# Patient Record
Sex: Female | Born: 1935 | Race: White | Hispanic: No | Smoking: Current every day smoker
Health system: Southern US, Community
[De-identification: ages and names within clinical notes are randomized; demographics above are authoritative.]

## PROBLEM LIST (undated history)

## (undated) DIAGNOSIS — D649 Anemia, unspecified: Secondary | ICD-10-CM

---

## 2019-11-01 ENCOUNTER — Ambulatory Visit: Payer: Self-pay | Attending: Internal Medicine

## 2019-11-01 DIAGNOSIS — Z23 Encounter for immunization: Secondary | ICD-10-CM | POA: Insufficient documentation

## 2019-11-01 NOTE — Progress Notes (Signed)
   Covid-19 Vaccination Clinic  Name:  Idolina Mantell    MRN: [id number] DOB: 15-Jul-1926  11/01/2019  Ms. Kinner was observed post Covid-19 immunization for 15 minutes without incidence. She was provided with Vaccine Information Sheet and instruction to access the V-Safe system.   Ms. Abdulla was instructed to call 911 with any severe reactions post vaccine: Marland Kitchen Difficulty breathing  . Swelling of your face and throat  . A fast heartbeat  . A bad rash all over your body  . Dizziness and weakness    Immunizations Administered    Name Date Dose VIS Date Route   Pfizer COVID-19 Vaccine 11/01/2019  4:29 PM 0.3 mL 08/14/2019 Intramuscular   Manufacturer: [employer], [employer]   Lot: [id number]   NDC: [address]-2

## 2019-12-02 ENCOUNTER — Ambulatory Visit: Payer: Self-pay | Attending: Internal Medicine

## 2019-12-02 DIAGNOSIS — Z23 Encounter for immunization: Secondary | ICD-10-CM

## 2019-12-02 NOTE — Progress Notes (Signed)
   Covid-19 Vaccination Clinic  Name:  Dawn Oneill    MRN: [id number] DOB: Sep 23, 1925  12/02/2019  [name] was observed post Covid-19 immunization for 15 minutes without incident. She was provided with Vaccine Information Sheet and instruction to access the V-Safe system.   [name] was instructed to call 911 with any severe reactions post vaccine: Marland Kitchen Difficulty breathing  . Swelling of face and throat  . A fast heartbeat  . A bad rash all over body  . Dizziness and weakness   Immunizations Administered    Name Date Dose VIS Date Route   Pfizer COVID-19 Vaccine 12/02/2019  4:43 PM 0.3 mL 08/14/2019 Intramuscular   Manufacturer: [employer], [employer]   Lot: [id number]   NDC: [address]-8

## 2020-07-02 ENCOUNTER — Ambulatory Visit: Payer: Medicare Other | Attending: Internal Medicine

## 2020-07-02 DIAGNOSIS — Z23 Encounter for immunization: Secondary | ICD-10-CM

## 2020-07-02 NOTE — Progress Notes (Signed)
   Covid-19 Vaccination Clinic  Name:  Avy Barlett    MRN: [id number] DOB: 03-27-1926  07/02/2020  Ms. Carlson was observed post Covid-19 immunization for 15 minutes without incident. She was provided with Vaccine Information Sheet and instruction to access the V-Safe system.   Ms. Duhart was instructed to call 911 with any severe reactions post vaccine: Marland Kitchen Difficulty breathing  . Swelling of face and throat  . A fast heartbeat  . A bad rash all over body  . Dizziness and weakness

## 2020-09-29 ENCOUNTER — Emergency Department (HOSPITAL_COMMUNITY): Payer: Medicare Other

## 2020-09-29 ENCOUNTER — Encounter (HOSPITAL_COMMUNITY): Payer: Self-pay

## 2020-09-29 ENCOUNTER — Inpatient Hospital Stay (HOSPITAL_COMMUNITY)
Admission: EM | Admit: 2020-09-29 | Discharge: 2020-10-05 | DRG: 480 | Disposition: A | Discharge status: Skilled Nursing Facility | Payer: Medicare Other | Attending: Internal Medicine | Admitting: Internal Medicine

## 2020-09-29 ENCOUNTER — Other Ambulatory Visit: Payer: Self-pay

## 2020-09-29 DIAGNOSIS — F17210 Nicotine dependence, cigarettes, uncomplicated: Secondary | ICD-10-CM | POA: Diagnosis present

## 2020-09-29 DIAGNOSIS — J9601 Acute respiratory failure with hypoxia: Secondary | ICD-10-CM | POA: Diagnosis not present

## 2020-09-29 DIAGNOSIS — Z20822 Contact with and (suspected) exposure to covid-19: Secondary | ICD-10-CM | POA: Diagnosis present

## 2020-09-29 DIAGNOSIS — Y92008 Other place in unspecified non-institutional (private) residence as the place of occurrence of the external cause: Secondary | ICD-10-CM

## 2020-09-29 DIAGNOSIS — R54 Age-related physical debility: Secondary | ICD-10-CM | POA: Diagnosis present

## 2020-09-29 DIAGNOSIS — S60511A Abrasion of right hand, initial encounter: Secondary | ICD-10-CM | POA: Diagnosis present

## 2020-09-29 DIAGNOSIS — S72002A Fracture of unspecified part of neck of left femur, initial encounter for closed fracture: Secondary | ICD-10-CM | POA: Diagnosis not present

## 2020-09-29 DIAGNOSIS — D72829 Elevated white blood cell count, unspecified: Secondary | ICD-10-CM | POA: Diagnosis present

## 2020-09-29 DIAGNOSIS — Z7982 Long term (current) use of aspirin: Secondary | ICD-10-CM

## 2020-09-29 DIAGNOSIS — W010XXA Fall on same level from slipping, tripping and stumbling without subsequent striking against object, initial encounter: Secondary | ICD-10-CM | POA: Diagnosis present

## 2020-09-29 DIAGNOSIS — F419 Anxiety disorder, unspecified: Secondary | ICD-10-CM | POA: Diagnosis present

## 2020-09-29 DIAGNOSIS — Z01811 Encounter for preprocedural respiratory examination: Secondary | ICD-10-CM

## 2020-09-29 DIAGNOSIS — I10 Essential (primary) hypertension: Secondary | ICD-10-CM | POA: Diagnosis present

## 2020-09-29 DIAGNOSIS — I714 Abdominal aortic aneurysm, without rupture, unspecified: Secondary | ICD-10-CM | POA: Diagnosis present

## 2020-09-29 DIAGNOSIS — G9341 Metabolic encephalopathy: Secondary | ICD-10-CM | POA: Diagnosis not present

## 2020-09-29 DIAGNOSIS — E039 Hypothyroidism, unspecified: Secondary | ICD-10-CM | POA: Diagnosis present

## 2020-09-29 DIAGNOSIS — M549 Dorsalgia, unspecified: Secondary | ICD-10-CM | POA: Diagnosis present

## 2020-09-29 DIAGNOSIS — F05 Delirium due to known physiological condition: Secondary | ICD-10-CM | POA: Diagnosis present

## 2020-09-29 DIAGNOSIS — R911 Solitary pulmonary nodule: Secondary | ICD-10-CM | POA: Diagnosis present

## 2020-09-29 DIAGNOSIS — S72142A Displaced intertrochanteric fracture of left femur, initial encounter for closed fracture: Secondary | ICD-10-CM | POA: Diagnosis present

## 2020-09-29 DIAGNOSIS — I6523 Occlusion and stenosis of bilateral carotid arteries: Secondary | ICD-10-CM | POA: Diagnosis not present

## 2020-09-29 DIAGNOSIS — G8929 Other chronic pain: Secondary | ICD-10-CM | POA: Diagnosis present

## 2020-09-29 DIAGNOSIS — Z79899 Other long term (current) drug therapy: Secondary | ICD-10-CM

## 2020-09-29 DIAGNOSIS — F32A Depression, unspecified: Secondary | ICD-10-CM | POA: Diagnosis present

## 2020-09-29 DIAGNOSIS — W19XXXA Unspecified fall, initial encounter: Secondary | ICD-10-CM

## 2020-09-29 DIAGNOSIS — I6529 Occlusion and stenosis of unspecified carotid artery: Secondary | ICD-10-CM | POA: Diagnosis present

## 2020-09-29 DIAGNOSIS — D509 Iron deficiency anemia, unspecified: Secondary | ICD-10-CM | POA: Diagnosis present

## 2020-09-29 DIAGNOSIS — S60512A Abrasion of left hand, initial encounter: Secondary | ICD-10-CM | POA: Diagnosis present

## 2020-09-29 DIAGNOSIS — Z79890 Hormone replacement therapy (postmenopausal): Secondary | ICD-10-CM

## 2020-09-29 DIAGNOSIS — D62 Acute posthemorrhagic anemia: Secondary | ICD-10-CM | POA: Diagnosis not present

## 2020-09-29 DIAGNOSIS — M25552 Pain in left hip: Secondary | ICD-10-CM | POA: Diagnosis present

## 2020-09-29 DIAGNOSIS — Z419 Encounter for procedure for purposes other than remedying health state, unspecified: Secondary | ICD-10-CM

## 2020-09-29 HISTORY — DX: Anemia, unspecified: D64.9

## 2020-09-29 LAB — CBC WITH DIFFERENTIAL/PLATELET
Abs Immature Granulocytes: 0 10*3/uL (ref 0.00–0.07)
Basophils Absolute: 0 10*3/uL (ref 0.0–0.1)
Basophils Relative: 0 %
Eosinophils Absolute: 0 10*3/uL (ref 0.0–0.5)
Eosinophils Relative: 0 %
HCT: 33.6 % — ABNORMAL LOW (ref 36.0–46.0)
Hemoglobin: 10.5 g/dL — ABNORMAL LOW (ref 12.0–15.0)
Lymphocytes Relative: 4 %
Lymphs Abs: 1 10*3/uL (ref 0.7–4.0)
MCH: 19.6 pg — ABNORMAL LOW (ref 26.0–34.0)
MCHC: 31.3 g/dL (ref 30.0–36.0)
MCV: 62.7 fL — ABNORMAL LOW (ref 80.0–100.0)
Monocytes Absolute: 0.5 10*3/uL (ref 0.1–1.0)
Monocytes Relative: 2 %
Neutro Abs: 22.5 10*3/uL — ABNORMAL HIGH (ref 1.7–7.7)
Neutrophils Relative %: 94 %
Platelets: 306 10*3/uL (ref 150–400)
RBC: 5.36 MIL/uL — ABNORMAL HIGH (ref 3.87–5.11)
RDW: 14.9 % (ref 11.5–15.5)
WBC: 23.9 10*3/uL — ABNORMAL HIGH (ref 4.0–10.5)
nRBC: 0 /100 WBC
nRBC: 0.1 % (ref 0.0–0.2)

## 2020-09-29 LAB — PROTIME-INR
INR: 1.3 — ABNORMAL HIGH (ref 0.8–1.2)
Prothrombin Time: 15.3 seconds — ABNORMAL HIGH (ref 11.4–15.2)

## 2020-09-29 LAB — COMPREHENSIVE METABOLIC PANEL
ALT: 14 U/L (ref 0–44)
AST: 21 U/L (ref 15–41)
Albumin: 3.5 g/dL (ref 3.5–5.0)
Alkaline Phosphatase: 83 U/L (ref 38–126)
Anion gap: 12 (ref 5–15)
BUN: 10 mg/dL (ref 8–23)
CO2: 23 mmol/L (ref 22–32)
Calcium: 9 mg/dL (ref 8.9–10.3)
Chloride: 101 mmol/L (ref 98–111)
Creatinine, Ser: 0.76 mg/dL (ref 0.44–1.00)
GFR, Estimated: >60 mL/min (ref >60)
Glucose, Bld: 144 mg/dL — ABNORMAL HIGH (ref 70–99)
Potassium: 3.8 mmol/L (ref 3.5–5.1)
Sodium: 136 mmol/L (ref 135–145)
Total Bilirubin: 1.2 mg/dL (ref 0.3–1.2)
Total Protein: 6.2 g/dL — ABNORMAL LOW (ref 6.5–8.1)

## 2020-09-29 LAB — SARS CORONAVIRUS 2 BY RT PCR (HOSPITAL ORDER, PERFORMED IN ~~LOC~~ HOSPITAL LAB): SARS Coronavirus 2: NEGATIVE

## 2020-09-29 LAB — TROPONIN I (HIGH SENSITIVITY): Troponin I (High Sensitivity): 4 ng/L (ref <18)

## 2020-09-29 LAB — TSH: TSH: 9.554 u[IU]/mL — ABNORMAL HIGH (ref 0.350–4.500)

## 2020-09-29 MED ORDER — SENNOSIDES-DOCUSATE SODIUM 8.6-50 MG PO TABS: 1.0000 | ORAL_TABLET | Freq: Every evening | ORAL | Status: DC | PRN

## 2020-09-29 MED ORDER — FENTANYL CITRATE (PF) 100 MCG/2ML IJ SOLN
12.5000 ug | INTRAMUSCULAR | Status: DC | PRN
  Administered 2020-09-29: 12.5 ug via INTRAVENOUS

## 2020-09-29 MED ORDER — LEVOTHYROXINE SODIUM 75 MCG PO TABS
75.0000 ug | ORAL_TABLET | Freq: Every day | ORAL | Status: DC
  Administered 2020-09-30: 75 ug via ORAL
  Filled 2020-09-29: qty 1

## 2020-09-29 MED ORDER — ALPRAZOLAM 0.25 MG PO TABS: 0.2500 mg | ORAL_TABLET | Freq: Two times a day (BID) | ORAL | Status: DC | PRN

## 2020-09-29 MED ORDER — SERTRALINE HCL 100 MG PO TABS
100.0000 mg | ORAL_TABLET | Freq: Every day | ORAL | Status: DC
  Administered 2020-10-01 – 2020-10-05 (×5): 100 mg via ORAL
  Filled 2020-09-29 (×6): qty 1

## 2020-09-29 MED ORDER — BACLOFEN 10 MG PO TABS
5.0000 mg | ORAL_TABLET | Freq: Three times a day (TID) | ORAL | Status: DC | PRN
  Administered 2020-09-29 – 2020-10-04 (×3): 5 mg via ORAL
  Filled 2020-09-29 (×2): qty 1

## 2020-09-29 MED ORDER — ATORVASTATIN CALCIUM 10 MG PO TABS
10.0000 mg | ORAL_TABLET | Freq: Every day | ORAL | Status: DC
  Administered 2020-09-29: 10 mg via ORAL
  Filled 2020-09-29: qty 1

## 2020-09-29 MED ORDER — FENTANYL CITRATE (PF) 100 MCG/2ML IJ SOLN
12.5000 ug | INTRAMUSCULAR | Status: DC | PRN
  Filled 2020-09-29: qty 2

## 2020-09-29 MED ORDER — AMLODIPINE BESYLATE 10 MG PO TABS
10.0000 mg | ORAL_TABLET | Freq: Every day | ORAL | Status: DC
Start: 2020-09-30 — End: 2020-09-30

## 2020-09-29 MED ORDER — GABAPENTIN 100 MG PO CAPS
100.0000 mg | ORAL_CAPSULE | Freq: Two times a day (BID) | ORAL | Status: DC
Start: 2020-09-29 — End: 2020-09-30
  Administered 2020-09-29: 100 mg via ORAL
  Filled 2020-09-29: qty 1

## 2020-09-29 MED ORDER — SODIUM CHLORIDE 0.9 % IV SOLN: INTRAVENOUS | Status: AC

## 2020-09-29 NOTE — Progress Notes (Signed)
Patient discussed with EDP.  Recommended urgent stabilization with surgery in the morning for this left hip fracture.  NPO tonight at [place].  Appreciate medical management by hospitalist service.

## 2020-09-29 NOTE — H&P (Signed)
History and Physical    [name] ERD:[id number] DOB: 22-Apr-1926 DOA: 09/29/2020  PCP: Patient, No Pcp Per   Patient coming from: Home   Chief Complaint: fall with left hip pain   HPI: Dawn Oneill is a 87 y.o. female with medical history significant for chronic microcytic anemia, chronic back and knee pain, carotid artery stenosis and AAA, hypothyroidism, hypertension, and ongoing tobacco abuse, now presenting to the emergency department for evaluation of severe left hip pain and deformity after a fall at home.  Patient reports that she was in her usual state of health when her dog bumped into her and caused her to fall.  She denies hitting her head or losing consciousness but experienced immediate and severe pain at the left hip and was unable to bear weight.  Patient reports that she ambulates unassisted normally, has a bedroom on a second floor, since flight of stairs multiple times daily without any significant dyspnea.  She never experiences chest pain or diaphoresis with exertion.  She lives with her daughter, no longer cooks, but does laundry and other housework and is generally independent.  She continues to smoke almost 1 pack/day.  ED Course: Upon arrival to the ED, patient is found to be afebrile, saturating well on room air, and with stable blood pressure.  EKG features sinus rhythm.  Noncontrast head CT negative for acute intracranial abnormality and no acute fracture on cervical spine CT.  Chest x-ray is notable for possible pulmonary nodule with outpatient CT recommended.  Plain films of the left hip and femur demonstrate acute displaced comminuted left proximal femur fracture.  Chemistry panel is unremarkable and CBC notable for leukocytosis to 23,900 and a microcytic anemia with hemoglobin 10.5.  Orthopedic surgery was consulted by the ED physician and hospitalist called for admission.  Review of Systems:  All other systems reviewed and apart from HPI, are  negative.  Past Medical History:  Diagnosis Date  . Anemia     History reviewed. No pertinent surgical history.  Social History:   reports that she has been smoking. She has been smoking about 1.00 pack per day. She has never used smokeless tobacco. No history on file for alcohol use and drug use.  Not on File  History reviewed. No pertinent family history.   Prior to Admission medications   Not on File    Physical Exam: Vitals:   09/29/20 2015 09/29/20 2130 09/29/20 2145 09/29/20 2200  BP: 138/70 117/77 138/70 (!) 143/70  Pulse: 100 97 (!) 101 95  Resp: (!) 22 (!) 21 (!) 24 (!) 28  Temp:      TempSrc:      SpO2: 95% 96% 95% 95%  Weight:      Height:        Constitutional: NAD, calm  Eyes: PERTLA, lids and conjunctivae normal ENMT: Mucous membranes are moist. Posterior pharynx clear of any exudate or lesions.   Neck: normal, supple, no masses, no thyromegaly Respiratory: no wheezing, no crackles. No accessory muscle use.  Cardiovascular: S1 & S2 heard, regular rate and rhythm. No extremity edema.  Abdomen: No distension, no tenderness, soft. Bowel sounds active.  Musculoskeletal: no clubbing / cyanosis. No joint deformity upper and lower extremities.   Skin: no significant rashes, lesions, ulcers. Warm, dry, well-perfused. Neurologic: CN 2-12 grossly intact. Sensation intact. Moving all extremities.  Psychiatric: Alert and oriented to person, place, and situation. Very pleasant and cooperative.    Labs and Imaging on Admission: I have personally  reviewed following labs and imaging studies  CBC: Recent Labs  Lab 09/29/20 1852  WBC 23.9*  NEUTROABS 22.5*  HGB 10.5*  HCT 33.6*  MCV 62.7*  PLT 306   Basic Metabolic Panel: Recent Labs  Lab 09/29/20 1852  NA 136  K 3.8  CL 101  CO2 23  GLUCOSE 144*  BUN 10  CREATININE 0.76  CALCIUM 9.0   GFR: Estimated Creatinine Clearance: 35.3 mL/min (by C-G formula based on SCr of 0.76 mg/dL). Liver Function  Tests: Recent Labs  Lab 09/29/20 1852  AST 21  ALT 14  ALKPHOS 83  BILITOT 1.2  PROT 6.2*  ALBUMIN 3.5   No results for input(s): LIPASE, AMYLASE in the last 168 hours. No results for input(s): AMMONIA in the last 168 hours. Coagulation Profile: Recent Labs  Lab 09/29/20 1852  INR 1.3*   Cardiac Enzymes: No results for input(s): CKTOTAL, CKMB, CKMBINDEX, TROPONINI in the last 168 hours. BNP (last 3 results) No results for input(s): PROBNP in the last 8760 hours. HbA1C: No results for input(s): HGBA1C in the last 72 hours. CBG: No results for input(s): GLUCAP in the last 168 hours. Lipid Profile: No results for input(s): CHOL, HDL, LDLCALC, TRIG, CHOLHDL, LDLDIRECT in the last 72 hours. Thyroid Function Tests: Recent Labs    09/29/20 2024  TSH 9.554*   Anemia Panel: No results for input(s): VITAMINB12, FOLATE, FERRITIN, TIBC, IRON, RETICCTPCT in the last 72 hours. Urine analysis: No results found for: COLORURINE, APPEARANCEUR, LABSPEC, PHURINE, GLUCOSEU, HGBUR, BILIRUBINUR, KETONESUR, PROTEINUR, UROBILINOGEN, NITRITE, LEUKOCYTESUR Sepsis Labs: @LABRCNTIP (procalcitonin:4,lacticidven:4) ) Recent Results (from the past 240 hour(s))  SARS Coronavirus 2 by RT PCR (hospital order, performed in [facility] hospital lab) Nasopharyngeal Nasopharyngeal Swab     Status: None   Collection Time: 09/29/20  6:52 PM   Specimen: Nasopharyngeal Swab  Result Value Ref Range Status   SARS Coronavirus 2 NEGATIVE NEGATIVE Final    Comment: (NOTE) SARS-CoV-2 target nucleic acids are NOT DETECTED.  The SARS-CoV-2 RNA is generally detectable in upper and lower respiratory specimens during the acute phase of infection. The lowest concentration of SARS-CoV-2 viral copies this assay can detect is 250 copies / mL. A negative result does not preclude SARS-CoV-2 infection and should not be used as the sole basis for treatment or other patient management decisions.  A negative result may occur  with improper specimen collection / handling, submission of specimen other than nasopharyngeal swab, presence of viral mutation(s) within the areas targeted by this assay, and inadequate number of viral copies (<250 copies / mL). A negative result must be combined with clinical observations, patient history, and epidemiological information.  Fact Sheet for Patients:   10/01/20  Fact Sheet for Healthcare Providers: BoilerBrush.com.cy  This test is not yet approved or  cleared by the https://pope.com/ FDA and has been authorized for detection and/or diagnosis of SARS-CoV-2 by FDA under an Emergency Use Authorization (EUA).  This EUA will remain in effect (meaning this test can be used) for the duration of the COVID-19 declaration under Section 564(b)(1) of the Act, 21 U.S.C. section 360bbb-3(b)(1), unless the authorization is terminated or revoked sooner.  Performed at [facility], [address] [place], [place]      Radiological Exams on Admission: DG Chest 1 View  Result Date: 09/29/2020 CLINICAL DATA:  Pain status post fall. EXAM: CHEST  1 VIEW COMPARISON:  None. FINDINGS: The heart size is unremarkable. There are aortic calcifications. There is suggestion of  a pulmonary nodule in the left mid lung zone. There is scarring and atelectasis at the lung bases. There is no pneumothorax. No large pleural effusion. No acute osseous abnormality. IMPRESSION: 1. Possible pulmonary nodule in the left mid lung zone. A follow-up outpatient CT of the chest is recommended for further evaluation. 2. Bibasilar scarring and atelectasis. 3. No acute cardiopulmonary process. Electronically Signed   By: Katherine Mantle M.D.   On: 09/29/2020 19:45   DG Pelvis 1-2 Views  Result Date: 09/29/2020 CLINICAL DATA:  Pain EXAM: PELVIS - 1-2 VIEW; LEFT FEMUR 2 VIEWS COMPARISON:  None. FINDINGS: There is an acute displaced, comminuted  intratrochanteric fracture of the proximal left femur. There is osteopenia. Advanced vascular calcifications are noted. There are degenerative changes of both hips. There is no acute osseous abnormality involving the distal femur. Mild degenerative changes are noted of the knee. IMPRESSION: 1. Acute displaced, comminuted intratrochanteric fracture of the proximal left femur. 2. Osteopenia. 3. Advanced vascular calcifications. Electronically Signed   By: Katherine Mantle M.D.   On: 09/29/2020 19:41   CT Head Wo Contrast  Result Date: 09/29/2020 CLINICAL DATA:  Fall.  Head and neck trauma. EXAM: CT HEAD WITHOUT CONTRAST CT CERVICAL SPINE WITHOUT CONTRAST TECHNIQUE: Multidetector CT imaging of the head and cervical spine was performed following the standard protocol without intravenous contrast. Multiplanar CT image reconstructions of the cervical spine were also generated. COMPARISON:  None. FINDINGS: CT HEAD FINDINGS Brain: Diffuse cerebral atrophy. Mild ventricular dilatation consistent with central atrophy. Low-attenuation changes in the deep white matter consistent with small vessel ischemia. No mass-effect or midline shift. No abnormal extra-axial fluid collections. Gray-white matter junctions are distinct. Basal cisterns are not effaced. No acute intracranial hemorrhage. Vascular: Mild intracranial arterial vascular calcifications. Skull: Calvarium appears intact. Degenerative changes in the temporomandibular joints. Sinuses/Orbits: Paranasal sinuses and mastoid air cells are clear. Other: None. CT CERVICAL SPINE FINDINGS Alignment: Normal alignment. Skull base and vertebrae: Diffuse bone demineralization. Skull base appears intact. No vertebral compression deformities. No focal bone lesion or bone destruction. Bone cortex appears intact. Soft tissues and spinal canal: No prevertebral soft tissue swelling. No abnormal paraspinal soft tissue mass or infiltration. Disc levels: Degenerative changes throughout  the cervical spine with narrowed interspaces and endplate hypertrophic changes throughout, most prominent at C4-5 and C5-6 levels. Degenerative changes throughout the facet joints. Upper chest: Emphysematous changes in the lung apices. Vascular calcifications. Calcified granulomas in the lung apices. Other: None. IMPRESSION: 1. No acute intracranial abnormalities. Chronic atrophy and small vessel ischemic changes. 2. Normal alignment of the cervical spine. Diffuse bone demineralization. Degenerative changes throughout the cervical spine. No acute displaced fractures identified. 3. Emphysematous changes and calcified granulomas in the lung apices. Emphysema (ICD10-J43.9). Electronically Signed   By: Burman Nieves M.D.   On: 09/29/2020 19:55   CT Cervical Spine Wo Contrast  Result Date: 09/29/2020 CLINICAL DATA:  Fall.  Head and neck trauma. EXAM: CT HEAD WITHOUT CONTRAST CT CERVICAL SPINE WITHOUT CONTRAST TECHNIQUE: Multidetector CT imaging of the head and cervical spine was performed following the standard protocol without intravenous contrast. Multiplanar CT image reconstructions of the cervical spine were also generated. COMPARISON:  None. FINDINGS: CT HEAD FINDINGS Brain: Diffuse cerebral atrophy. Mild ventricular dilatation consistent with central atrophy. Low-attenuation changes in the deep white matter consistent with small vessel ischemia. No mass-effect or midline shift. No abnormal extra-axial fluid collections. Gray-white matter junctions are distinct. Basal cisterns are not effaced. No acute intracranial hemorrhage. Vascular: Mild intracranial arterial  vascular calcifications. Skull: Calvarium appears intact. Degenerative changes in the temporomandibular joints. Sinuses/Orbits: Paranasal sinuses and mastoid air cells are clear. Other: None. CT CERVICAL SPINE FINDINGS Alignment: Normal alignment. Skull base and vertebrae: Diffuse bone demineralization. Skull base appears intact. No vertebral  compression deformities. No focal bone lesion or bone destruction. Bone cortex appears intact. Soft tissues and spinal canal: No prevertebral soft tissue swelling. No abnormal paraspinal soft tissue mass or infiltration. Disc levels: Degenerative changes throughout the cervical spine with narrowed interspaces and endplate hypertrophic changes throughout, most prominent at C4-5 and C5-6 levels. Degenerative changes throughout the facet joints. Upper chest: Emphysematous changes in the lung apices. Vascular calcifications. Calcified granulomas in the lung apices. Other: None. IMPRESSION: 1. No acute intracranial abnormalities. Chronic atrophy and small vessel ischemic changes. 2. Normal alignment of the cervical spine. Diffuse bone demineralization. Degenerative changes throughout the cervical spine. No acute displaced fractures identified. 3. Emphysematous changes and calcified granulomas in the lung apices. Emphysema (ICD10-J43.9). Electronically Signed   By: Burman Nieves M.D.   On: 09/29/2020 19:55   DG Femur Min 2 Views Left  Result Date: 09/29/2020 CLINICAL DATA:  Pain EXAM: PELVIS - 1-2 VIEW; LEFT FEMUR 2 VIEWS COMPARISON:  None. FINDINGS: There is an acute displaced, comminuted intratrochanteric fracture of the proximal left femur. There is osteopenia. Advanced vascular calcifications are noted. There are degenerative changes of both hips. There is no acute osseous abnormality involving the distal femur. Mild degenerative changes are noted of the knee. IMPRESSION: 1. Acute displaced, comminuted intratrochanteric fracture of the proximal left femur. 2. Osteopenia. 3. Advanced vascular calcifications. Electronically Signed   By: Katherine Mantle M.D.   On: 09/29/2020 19:41    EKG: Independently reviewed. Sinus rhythm.   Assessment/Plan   1. Left hip fracture  - Presents with severe left hip pain and deformity after a trip and fall at home and is found to have acute left hip fracture  -  Orthopedic surgery consulting and much appreciated  - Based on the available data, Mrs. Santaana presents an estimated 1% risk of perioperative MI or cardiac arrest  - Continue pain-control, CMS checks, NPO after midnight, hold ASA and ARB, supportive care    2. Leukocytosis  - WBC is 23,900 on admission without fever or apparent infectious process  - Likely reactive, monitor, culture if febrile   3. Anemia  - Hgb is 10.5 on admission, down from 11.9 in May 2021  - No overt bleeding  - Type and screen, monitor   4. Hypertension  - BP at goal, hold ARB preoperatively, continue Norvasc as tolerated    5. Hypothyroidism  - Continue Synthroid   6. Carotid artery stenosis; AAA   - Asymptomatic, followed by vascular surgery  - Continue statin, encourage smoking cessation   7. Anxiety, depression  - Continue Zoloft and low-dose Xanax as needed   8. Lung nodule  - Suspected lung nodule noted incidentally on preoperative CXR  - Further evaluation with outpatient CT recommended    DVT prophylaxis: SCDs  Code Status: Full  Level of Care: Level of care: Med-Surg Family Communication: Unable to reach daughter at listed number at time of admission  Disposition Plan:  Patient is from: Home  Anticipated d/c is to: TBD Anticipated d/c date is: 10/02/20 Patient currently: pending operative repair of left hip  Consults called: Orthopedic surgery  Admission status: Inpatient     Briscoe Deutscher, MD [facility]  09/29/2020, 10:43 PM

## 2020-09-29 NOTE — ED Triage Notes (Signed)
Pt arrives via [facility] for fall. Pt states her dog tripped her and she fell. Denies LOC, pt A+Ox4 on arrival, not on thinners. Pt's L leg shortened and outwardly rotated on assessment. PMS intact.   #18 L AC by EMS   EMS last VS - 141/77, HR 99, RR 16 94% on RA.

## 2020-09-29 NOTE — ED Provider Notes (Signed)
Emergency Department Provider Note   I have reviewed the triage vital signs and the nursing notes.   HISTORY  Chief Complaint Hip Injury and Fall   HPI Dawn Oneill is a 87 y.o. female with past medical history of anemia presents to the emergency department for evaluation after fall with left hip and leg pain.  Patient states she lives at home with her daughter.  She was walking down the hallway when she tripped over her dog and fell.  She landed on her left side and notes severe pain with any movement of the leg although at rest is having fairly minimal pain.  Denies any numbness or tingling.  She does not recall hitting her head.  She is not hurting in her arms although there are abrasions to the hands.  Denies any recent fevers or chills.  She states overall she is been feeling well until tripping today.  Denies any presyncope symptoms.   Past Medical History:  Diagnosis Date  . Anemia     There are no problems to display for this patient.  Allergies Patient has no allergy information on record.  No family history on file.  Social History    Review of Systems  Constitutional: No fever/chills Eyes: No visual changes. Cardiovascular: Denies chest pain. Respiratory: Denies shortness of breath. Gastrointestinal: No abdominal pain.   Genitourinary: Negative for dysuria. Musculoskeletal: Negative for back pain. Positive left leg/hip pain.  Skin: Negative for rash. Neurological: Negative for headaches, focal weakness or numbness.  10-point ROS otherwise negative.  ____________________________________________   PHYSICAL EXAM:  VITAL SIGNS: ED Triage Vitals  Enc Vitals Group     BP 09/29/20 1844 122/64     Pulse Rate 09/29/20 1844 96     Resp 09/29/20 1844 (!) 23     Temp 09/29/20 1844 97.8 F (36.6 C)     Temp Source 09/29/20 1844 Oral     SpO2 09/29/20 1839 94 %     Weight 09/29/20 1840 110 lb (49.9 kg)     Height 09/29/20 1840 5\' 2"  (1.575 m)    Constitutional: Alert and oriented. Well appearing and in no acute distress. Patient is hard of hearing but able to hear with elevated voice.  Eyes: Conjunctivae are normal. PERRL.  Head: Atraumatic. Nose: No congestion/rhinnorhea. Mouth/Throat: Mucous membranes are moist.   Neck: No stridor. No cervical spine tenderness to palpation. Cardiovascular: Normal rate, regular rhythm. Good peripheral circulation. Grossly normal heart sounds. Palpable DP/PT pulses on the left and right.  Respiratory: Normal respiratory effort.  No retractions. Lungs CTAB. Gastrointestinal: Soft and nontender. No distention.  Musculoskeletal: Shortening and external rotation of the LLE. No tenderness or swelling over the knee or ankle. Some mid thigh tenderness and swelling on the left.  Neurologic:  Normal speech and language. Normal sensation in the bilateral feet and lower legs.  Skin:  Skin is warm, dry and intact. No rash noted.   ____________________________________________   LABS (all labs ordered are listed, but only abnormal results are displayed)  Labs Reviewed  COMPREHENSIVE METABOLIC PANEL - Abnormal; Notable for the following components:      Result Value   Glucose, Bld 144 (*)    Total Protein 6.2 (*)    All other components within normal limits  CBC WITH DIFFERENTIAL/PLATELET - Abnormal; Notable for the following components:   WBC 23.9 (*)    RBC 5.36 (*)    Hemoglobin 10.5 (*)    HCT 33.6 (*)  MCV 62.7 (*)    MCH 19.6 (*)    Neutro Abs 22.5 (*)    All other components within normal limits  PROTIME-INR - Abnormal; Notable for the following components:   Prothrombin Time 15.3 (*)    INR 1.3 (*)    All other components within normal limits  SARS CORONAVIRUS 2 BY RT PCR (HOSPITAL ORDER, PERFORMED IN [facility])  TSH  TYPE AND SCREEN  ABO/RH  TROPONIN I (HIGH SENSITIVITY)  TROPONIN I (HIGH SENSITIVITY)   ____________________________________________  EKG   EKG  Interpretation  Date/Time:  Thursday September 29 2020 18:53:46 EST Ventricular Rate:  97 PR Interval:    QRS Duration: 94 QT Interval:  361 QTC Calculation: 459 R Axis:   10 Text Interpretation: Sinus rhythm Biatrial enlargement Anterior infarct, old No STEMI Confirmed by Alona Bene [phone]) on 09/29/2020 6:58:21 PM       ____________________________________________  RADIOLOGY  DG Chest 1 View  Result Date: 09/29/2020 CLINICAL DATA:  Pain status post fall. EXAM: CHEST  1 VIEW COMPARISON:  None. FINDINGS: The heart size is unremarkable. There are aortic calcifications. There is suggestion of a pulmonary nodule in the left mid lung zone. There is scarring and atelectasis at the lung bases. There is no pneumothorax. No large pleural effusion. No acute osseous abnormality. IMPRESSION: 1. Possible pulmonary nodule in the left mid lung zone. A follow-up outpatient CT of the chest is recommended for further evaluation. 2. Bibasilar scarring and atelectasis. 3. No acute cardiopulmonary process. Electronically Signed   By: Katherine Mantle M.D.   On: 09/29/2020 19:45   DG Pelvis 1-2 Views  Result Date: 09/29/2020 CLINICAL DATA:  Pain EXAM: PELVIS - 1-2 VIEW; LEFT FEMUR 2 VIEWS COMPARISON:  None. FINDINGS: There is an acute displaced, comminuted intratrochanteric fracture of the proximal left femur. There is osteopenia. Advanced vascular calcifications are noted. There are degenerative changes of both hips. There is no acute osseous abnormality involving the distal femur. Mild degenerative changes are noted of the knee. IMPRESSION: 1. Acute displaced, comminuted intratrochanteric fracture of the proximal left femur. 2. Osteopenia. 3. Advanced vascular calcifications. Electronically Signed   By: Katherine Mantle M.D.   On: 09/29/2020 19:41   CT Head Wo Contrast  Result Date: 09/29/2020 CLINICAL DATA:  Fall.  Head and neck trauma. EXAM: CT HEAD WITHOUT CONTRAST CT CERVICAL SPINE WITHOUT CONTRAST  TECHNIQUE: Multidetector CT imaging of the head and cervical spine was performed following the standard protocol without intravenous contrast. Multiplanar CT image reconstructions of the cervical spine were also generated. COMPARISON:  None. FINDINGS: CT HEAD FINDINGS Brain: Diffuse cerebral atrophy. Mild ventricular dilatation consistent with central atrophy. Low-attenuation changes in the deep white matter consistent with small vessel ischemia. No mass-effect or midline shift. No abnormal extra-axial fluid collections. Gray-white matter junctions are distinct. Basal cisterns are not effaced. No acute intracranial hemorrhage. Vascular: Mild intracranial arterial vascular calcifications. Skull: Calvarium appears intact. Degenerative changes in the temporomandibular joints. Sinuses/Orbits: Paranasal sinuses and mastoid air cells are clear. Other: None. CT CERVICAL SPINE FINDINGS Alignment: Normal alignment. Skull base and vertebrae: Diffuse bone demineralization. Skull base appears intact. No vertebral compression deformities. No focal bone lesion or bone destruction. Bone cortex appears intact. Soft tissues and spinal canal: No prevertebral soft tissue swelling. No abnormal paraspinal soft tissue mass or infiltration. Disc levels: Degenerative changes throughout the cervical spine with narrowed interspaces and endplate hypertrophic changes throughout, most prominent at C4-5 and C5-6 levels. Degenerative changes throughout the facet  joints. Upper chest: Emphysematous changes in the lung apices. Vascular calcifications. Calcified granulomas in the lung apices. Other: None. IMPRESSION: 1. No acute intracranial abnormalities. Chronic atrophy and small vessel ischemic changes. 2. Normal alignment of the cervical spine. Diffuse bone demineralization. Degenerative changes throughout the cervical spine. No acute displaced fractures identified. 3. Emphysematous changes and calcified granulomas in the lung apices. Emphysema  (ICD10-J43.9). Electronically Signed   By: Burman Nieves M.D.   On: 09/29/2020 19:55   CT Cervical Spine Wo Contrast  Result Date: 09/29/2020 CLINICAL DATA:  Fall.  Head and neck trauma. EXAM: CT HEAD WITHOUT CONTRAST CT CERVICAL SPINE WITHOUT CONTRAST TECHNIQUE: Multidetector CT imaging of the head and cervical spine was performed following the standard protocol without intravenous contrast. Multiplanar CT image reconstructions of the cervical spine were also generated. COMPARISON:  None. FINDINGS: CT HEAD FINDINGS Brain: Diffuse cerebral atrophy. Mild ventricular dilatation consistent with central atrophy. Low-attenuation changes in the deep white matter consistent with small vessel ischemia. No mass-effect or midline shift. No abnormal extra-axial fluid collections. Gray-white matter junctions are distinct. Basal cisterns are not effaced. No acute intracranial hemorrhage. Vascular: Mild intracranial arterial vascular calcifications. Skull: Calvarium appears intact. Degenerative changes in the temporomandibular joints. Sinuses/Orbits: Paranasal sinuses and mastoid air cells are clear. Other: None. CT CERVICAL SPINE FINDINGS Alignment: Normal alignment. Skull base and vertebrae: Diffuse bone demineralization. Skull base appears intact. No vertebral compression deformities. No focal bone lesion or bone destruction. Bone cortex appears intact. Soft tissues and spinal canal: No prevertebral soft tissue swelling. No abnormal paraspinal soft tissue mass or infiltration. Disc levels: Degenerative changes throughout the cervical spine with narrowed interspaces and endplate hypertrophic changes throughout, most prominent at C4-5 and C5-6 levels. Degenerative changes throughout the facet joints. Upper chest: Emphysematous changes in the lung apices. Vascular calcifications. Calcified granulomas in the lung apices. Other: None. IMPRESSION: 1. No acute intracranial abnormalities. Chronic atrophy and small vessel  ischemic changes. 2. Normal alignment of the cervical spine. Diffuse bone demineralization. Degenerative changes throughout the cervical spine. No acute displaced fractures identified. 3. Emphysematous changes and calcified granulomas in the lung apices. Emphysema (ICD10-J43.9). Electronically Signed   By: Burman Nieves M.D.   On: 09/29/2020 19:55   DG Femur Min 2 Views Left  Result Date: 09/29/2020 CLINICAL DATA:  Pain EXAM: PELVIS - 1-2 VIEW; LEFT FEMUR 2 VIEWS COMPARISON:  None. FINDINGS: There is an acute displaced, comminuted intratrochanteric fracture of the proximal left femur. There is osteopenia. Advanced vascular calcifications are noted. There are degenerative changes of both hips. There is no acute osseous abnormality involving the distal femur. Mild degenerative changes are noted of the knee. IMPRESSION: 1. Acute displaced, comminuted intratrochanteric fracture of the proximal left femur. 2. Osteopenia. 3. Advanced vascular calcifications. Electronically Signed   By: Katherine Mantle M.D.   On: 09/29/2020 19:41    ____________________________________________   PROCEDURES  Procedure(s) performed:   Procedures  None ____________________________________________   INITIAL IMPRESSION / ASSESSMENT AND PLAN / ED COURSE  Pertinent labs & imaging results that were available during my care of the patient were reviewed by me and considered in my medical decision making (see chart for details).   Patient presents to the emergency department for evaluation after mechanical fall with left hip pain.  Clinically the hip appears broken.  Will obtain plain film to rule out dislocation.  Patient is not anticoagulated.  Given possibility for distracting injury will scan the cervical spine as well as perform CT scan of the  head.  Have ordered labs and COVID swab with anticipate admission for hip repair.   CT imaging reviewed and unremarkable. Patient in left intertrochanteric hip fracture on  the left. Labs reviewed. Leukocytosis noted but no infection source or symptoms to correlate. Suspect elevated with acute phase reactant. Spoke with Dr. Aundria Rud with ortho. Keep NPO after midnight and they will try and fit her in for the OR tomorrow.   Discussed patient's case with [facility] to request admission. Patient and family (if present) updated with plan. Care transferred to [facility] service.  I reviewed all nursing notes, vitals, pertinent old records, EKGs, labs, imaging (as available).  ____________________________________________  FINAL CLINICAL IMPRESSION(S) / ED DIAGNOSES  Final diagnoses:  Fall, initial encounter  Closed fracture of left hip, initial encounter (HCC)    MEDICATIONS GIVEN DURING THIS VISIT:  Medications  fentaNYL (SUBLIMAZE) injection 12.5 mcg (has no administration in time range)    Note:  This document was prepared using Dragon voice recognition software and may include unintentional dictation errors.  Alona Bene, MD, [facility] Emergency Medicine    Benelli Winther, Arlyss Repress, MD 09/30/20 [phone]

## 2020-09-29 NOTE — ED Notes (Signed)
Pt transported to Xray. 

## 2020-09-29 NOTE — ED Notes (Signed)
Hospital bed ordered for pt.

## 2020-09-30 ENCOUNTER — Encounter (HOSPITAL_COMMUNITY): Payer: Self-pay | Admitting: Family Medicine

## 2020-09-30 ENCOUNTER — Inpatient Hospital Stay (HOSPITAL_COMMUNITY): Payer: Medicare Other

## 2020-09-30 ENCOUNTER — Inpatient Hospital Stay (HOSPITAL_COMMUNITY): Payer: Medicare Other | Admitting: Certified Registered Nurse Anesthetist

## 2020-09-30 ENCOUNTER — Encounter (HOSPITAL_COMMUNITY)
Admission: EM | Disposition: A | Discharge status: Skilled Nursing Facility | Payer: Self-pay | Source: Home / Self Care | Attending: Internal Medicine

## 2020-09-30 HISTORY — PX: INTRAMEDULLARY (IM) NAIL INTERTROCHANTERIC: SHX5875

## 2020-09-30 LAB — T4, FREE: Free T4: 1.11 ng/dL (ref 0.61–1.12)

## 2020-09-30 LAB — BASIC METABOLIC PANEL
Anion gap: 8 (ref 5–15)
BUN: 10 mg/dL (ref 8–23)
CO2: 25 mmol/L (ref 22–32)
Calcium: 8 mg/dL — ABNORMAL LOW (ref 8.9–10.3)
Chloride: 103 mmol/L (ref 98–111)
Creatinine, Ser: 0.83 mg/dL (ref 0.44–1.00)
GFR, Estimated: >60 mL/min (ref >60)
Glucose, Bld: 127 mg/dL — ABNORMAL HIGH (ref 70–99)
Potassium: 4.2 mmol/L (ref 3.5–5.1)
Sodium: 136 mmol/L (ref 135–145)

## 2020-09-30 LAB — CBC
HCT: 24 % — ABNORMAL LOW (ref 36.0–46.0)
Hemoglobin: 7.8 g/dL — ABNORMAL LOW (ref 12.0–15.0)
MCH: 20.5 pg — ABNORMAL LOW (ref 26.0–34.0)
MCHC: 32.5 g/dL (ref 30.0–36.0)
MCV: 63 fL — ABNORMAL LOW (ref 80.0–100.0)
Platelets: 214 10*3/uL (ref 150–400)
RBC: 3.81 MIL/uL — ABNORMAL LOW (ref 3.87–5.11)
RDW: 14.7 % (ref 11.5–15.5)
WBC: 13.5 10*3/uL — ABNORMAL HIGH (ref 4.0–10.5)
nRBC: 0 % (ref 0.0–0.2)

## 2020-09-30 LAB — ABO/RH: ABO/RH(D): AB POS

## 2020-09-30 SURGERY — FIXATION, FRACTURE, INTERTROCHANTERIC, WITH INTRAMEDULLARY ROD
Anesthesia: Spinal | Laterality: Left

## 2020-09-30 MED ORDER — ENSURE ENLIVE PO LIQD
237.0000 mL | Freq: Two times a day (BID) | ORAL | Status: DC
  Administered 2020-10-01 – 2020-10-05 (×5): 237 mL via ORAL

## 2020-09-30 MED ORDER — MENTHOL 3 MG MT LOZG: 1.0000 | LOZENGE | OROMUCOSAL | Status: DC | PRN

## 2020-09-30 MED ORDER — HYDROCODONE-ACETAMINOPHEN 7.5-325 MG PO TABS
1.0000 | ORAL_TABLET | ORAL | Status: DC | PRN
  Administered 2020-10-02: 1 via ORAL
  Filled 2020-09-30: qty 1

## 2020-09-30 MED ORDER — ACETAMINOPHEN 325 MG PO TABS
325.0000 mg | ORAL_TABLET | Freq: Four times a day (QID) | ORAL | Status: DC | PRN
Start: 2020-10-01 — End: 2020-10-05
  Administered 2020-09-30 – 2020-10-05 (×6): 650 mg via ORAL
  Filled 2020-09-30 (×5): qty 2

## 2020-09-30 MED ORDER — METHOCARBAMOL 1000 MG/10ML IJ SOLN
500.0000 mg | Freq: Four times a day (QID) | INTRAVENOUS | Status: DC | PRN
  Filled 2020-09-30: qty 5

## 2020-09-30 MED ORDER — PROPOFOL 10 MG/ML IV BOLUS
INTRAVENOUS | Status: AC
  Filled 2020-09-30: qty 20

## 2020-09-30 MED ORDER — TRANEXAMIC ACID-NACL 1000-0.7 MG/100ML-% IV SOLN
INTRAVENOUS | Status: DC | PRN
  Administered 2020-09-30: 1000 mg via INTRAVENOUS

## 2020-09-30 MED ORDER — AMLODIPINE BESYLATE 10 MG PO TABS
10.0000 mg | ORAL_TABLET | Freq: Every day | ORAL | Status: DC
Start: 2020-09-30 — End: 2020-10-01
  Administered 2020-09-30: 10 mg via ORAL
  Filled 2020-09-30: qty 1

## 2020-09-30 MED ORDER — LEVOTHYROXINE SODIUM 75 MCG PO TABS
75.0000 ug | ORAL_TABLET | Freq: Every day | ORAL | Status: DC
  Administered 2020-10-02 – 2020-10-05 (×4): 75 ug via ORAL
  Filled 2020-09-30 (×5): qty 1

## 2020-09-30 MED ORDER — PHENYLEPHRINE 40 MCG/ML (10ML) SYRINGE FOR IV PUSH (FOR BLOOD PRESSURE SUPPORT)
PREFILLED_SYRINGE | INTRAVENOUS | Status: DC | PRN
  Administered 2020-09-30: 160 ug via INTRAVENOUS
  Administered 2020-09-30: 120 ug via INTRAVENOUS
  Administered 2020-09-30: 200 ug via INTRAVENOUS
  Administered 2020-09-30: 120 ug via INTRAVENOUS

## 2020-09-30 MED ORDER — ATORVASTATIN CALCIUM 10 MG PO TABS
10.0000 mg | ORAL_TABLET | Freq: Every day | ORAL | Status: DC
  Administered 2020-09-30 – 2020-10-05 (×6): 10 mg via ORAL
  Filled 2020-09-30 (×6): qty 1

## 2020-09-30 MED ORDER — PHENYLEPHRINE 40 MCG/ML (10ML) SYRINGE FOR IV PUSH (FOR BLOOD PRESSURE SUPPORT)
PREFILLED_SYRINGE | INTRAVENOUS | Status: AC
  Filled 2020-09-30: qty 10

## 2020-09-30 MED ORDER — SODIUM CHLORIDE 0.9 % IV SOLN: INTRAVENOUS | Status: AC

## 2020-09-30 MED ORDER — PROPOFOL 500 MG/50ML IV EMUL
INTRAVENOUS | Status: DC | PRN
  Administered 2020-09-30: 75 ug/kg/min via INTRAVENOUS

## 2020-09-30 MED ORDER — ACETAMINOPHEN 500 MG PO TABS
ORAL_TABLET | ORAL | Status: AC
  Administered 2020-09-30: 1000 mg via ORAL
  Filled 2020-09-30: qty 2

## 2020-09-30 MED ORDER — ACETAMINOPHEN 500 MG PO TABS: 1000.0000 mg | ORAL_TABLET | Freq: Once | ORAL | Status: AC

## 2020-09-30 MED ORDER — ONDANSETRON HCL 4 MG/2ML IJ SOLN
INTRAMUSCULAR | Status: AC
  Filled 2020-09-30: qty 2

## 2020-09-30 MED ORDER — AMISULPRIDE (ANTIEMETIC) 5 MG/2ML IV SOLN: 10.0000 mg | Freq: Once | INTRAVENOUS | Status: DC | PRN

## 2020-09-30 MED ORDER — CHLORHEXIDINE GLUCONATE 0.12 % MT SOLN
OROMUCOSAL | Status: AC
  Administered 2020-09-30: 15 mL
  Filled 2020-09-30: qty 15

## 2020-09-30 MED ORDER — ONDANSETRON HCL 4 MG/2ML IJ SOLN: 4.0000 mg | Freq: Four times a day (QID) | INTRAMUSCULAR | Status: DC | PRN

## 2020-09-30 MED ORDER — METOCLOPRAMIDE HCL 5 MG/ML IJ SOLN: 5.0000 mg | Freq: Three times a day (TID) | INTRAMUSCULAR | Status: DC | PRN

## 2020-09-30 MED ORDER — LACTATED RINGERS IV SOLN: INTRAVENOUS | Status: DC | PRN

## 2020-09-30 MED ORDER — ALBUMIN HUMAN 5 % IV SOLN: 12.5000 g | Freq: Once | INTRAVENOUS | Status: AC

## 2020-09-30 MED ORDER — METOCLOPRAMIDE HCL 5 MG PO TABS: 5.0000 mg | ORAL_TABLET | Freq: Three times a day (TID) | ORAL | Status: DC | PRN

## 2020-09-30 MED ORDER — GABAPENTIN 100 MG PO CAPS
100.0000 mg | ORAL_CAPSULE | Freq: Two times a day (BID) | ORAL | Status: DC
  Administered 2020-09-30 – 2020-10-05 (×11): 100 mg via ORAL
  Filled 2020-09-30 (×11): qty 1

## 2020-09-30 MED ORDER — POVIDONE-IODINE 10 % EX SWAB: 2.0000 "application " | Freq: Once | CUTANEOUS | Status: DC

## 2020-09-30 MED ORDER — FENTANYL CITRATE (PF) 100 MCG/2ML IJ SOLN: 25.0000 ug | INTRAMUSCULAR | Status: DC | PRN

## 2020-09-30 MED ORDER — ONDANSETRON HCL 4 MG/2ML IJ SOLN
INTRAMUSCULAR | Status: DC | PRN
  Administered 2020-09-30: 4 mg via INTRAVENOUS

## 2020-09-30 MED ORDER — ONDANSETRON HCL 4 MG PO TABS: 4.0000 mg | ORAL_TABLET | Freq: Four times a day (QID) | ORAL | Status: DC | PRN

## 2020-09-30 MED ORDER — CHLORHEXIDINE GLUCONATE 4 % EX LIQD: 60.0000 mL | Freq: Once | CUTANEOUS | Status: DC

## 2020-09-30 MED ORDER — TRANEXAMIC ACID-NACL 1000-0.7 MG/100ML-% IV SOLN
INTRAVENOUS | Status: AC
  Filled 2020-09-30: qty 100

## 2020-09-30 MED ORDER — CEFAZOLIN SODIUM-DEXTROSE 2-4 GM/100ML-% IV SOLN
2.0000 g | Freq: Four times a day (QID) | INTRAVENOUS | Status: AC
  Administered 2020-09-30 (×2): 2 g via INTRAVENOUS
  Filled 2020-09-30 (×2): qty 100

## 2020-09-30 MED ORDER — BACLOFEN 10 MG PO TABS
5.0000 mg | ORAL_TABLET | Freq: Every day | ORAL | Status: DC
  Administered 2020-09-30 – 2020-10-05 (×6): 5 mg via ORAL
  Filled 2020-09-30 (×6): qty 1

## 2020-09-30 MED ORDER — ALPRAZOLAM 0.25 MG PO TABS
0.2500 mg | ORAL_TABLET | Freq: Three times a day (TID) | ORAL | Status: DC | PRN
  Administered 2020-09-30: 0.5 mg via ORAL
  Administered 2020-10-01 – 2020-10-05 (×2): 0.25 mg via ORAL
  Filled 2020-09-30: qty 1
  Filled 2020-09-30 (×2): qty 2

## 2020-09-30 MED ORDER — METHOCARBAMOL 500 MG PO TABS
500.0000 mg | ORAL_TABLET | Freq: Four times a day (QID) | ORAL | Status: DC | PRN
  Administered 2020-10-01 – 2020-10-04 (×2): 500 mg via ORAL
  Filled 2020-09-30 (×3): qty 1

## 2020-09-30 MED ORDER — CEFAZOLIN SODIUM-DEXTROSE 2-4 GM/100ML-% IV SOLN
INTRAVENOUS | Status: AC
  Filled 2020-09-30: qty 100

## 2020-09-30 MED ORDER — DOCUSATE SODIUM 100 MG PO CAPS
100.0000 mg | ORAL_CAPSULE | Freq: Two times a day (BID) | ORAL | Status: DC
  Administered 2020-09-30 – 2020-10-05 (×11): 100 mg via ORAL
  Filled 2020-09-30 (×11): qty 1

## 2020-09-30 MED ORDER — PHENOL 1.4 % MT LIQD: 1.0000 | OROMUCOSAL | Status: DC | PRN

## 2020-09-30 MED ORDER — ADULT MULTIVITAMIN W/MINERALS CH: 1.0000 | ORAL_TABLET | Freq: Every day | ORAL | Status: DC

## 2020-09-30 MED ORDER — ENOXAPARIN SODIUM 40 MG/0.4ML ~~LOC~~ SOLN
40.0000 mg | SUBCUTANEOUS | Status: DC
  Administered 2020-10-01 – 2020-10-05 (×5): 40 mg via SUBCUTANEOUS
  Filled 2020-09-30 (×5): qty 0.4

## 2020-09-30 MED ORDER — 0.9 % SODIUM CHLORIDE (POUR BTL) OPTIME
TOPICAL | Status: DC | PRN
  Administered 2020-09-30: 1000 mL

## 2020-09-30 MED ORDER — MORPHINE SULFATE (PF) 2 MG/ML IV SOLN: 0.5000 mg | INTRAVENOUS | Status: DC | PRN

## 2020-09-30 MED ORDER — BUPIVACAINE HCL (PF) 0.5 % IJ SOLN
INTRAMUSCULAR | Status: DC | PRN
  Administered 2020-09-30: 10 mg

## 2020-09-30 MED ORDER — ASPIRIN EC 81 MG PO TBEC
81.0000 mg | DELAYED_RELEASE_TABLET | Freq: Every day | ORAL | Status: DC
  Administered 2020-09-30 – 2020-10-05 (×6): 81 mg via ORAL
  Filled 2020-09-30 (×6): qty 1

## 2020-09-30 MED ORDER — SENNA 8.6 MG PO TABS
1.0000 | ORAL_TABLET | Freq: Two times a day (BID) | ORAL | Status: DC
  Administered 2020-09-30 – 2020-10-05 (×11): 8.6 mg via ORAL
  Filled 2020-09-30 (×11): qty 1

## 2020-09-30 MED ORDER — CELECOXIB 200 MG PO CAPS
ORAL_CAPSULE | ORAL | Status: AC
  Administered 2020-09-30: 200 mg via ORAL
  Filled 2020-09-30: qty 1

## 2020-09-30 MED ORDER — CELECOXIB 200 MG PO CAPS: 200.0000 mg | ORAL_CAPSULE | Freq: Once | ORAL | Status: AC

## 2020-09-30 MED ORDER — PHENYLEPHRINE HCL-NACL 10-0.9 MG/250ML-% IV SOLN
INTRAVENOUS | Status: DC | PRN
  Administered 2020-09-30: 100 ug/min via INTRAVENOUS
  Administered 2020-09-30: 50 ug/min via INTRAVENOUS

## 2020-09-30 MED ORDER — ALBUMIN HUMAN 5 % IV SOLN
INTRAVENOUS | Status: AC
  Administered 2020-09-30: 12.5 g via INTRAVENOUS
  Filled 2020-09-30: qty 250

## 2020-09-30 MED ORDER — HYDROCODONE-ACETAMINOPHEN 5-325 MG PO TABS
1.0000 | ORAL_TABLET | ORAL | Status: DC | PRN
  Administered 2020-10-03 – 2020-10-04 (×2): 1 via ORAL
  Filled 2020-09-30: qty 2
  Filled 2020-09-30 (×3): qty 1

## 2020-09-30 MED ORDER — FENTANYL CITRATE (PF) 100 MCG/2ML IJ SOLN
INTRAMUSCULAR | Status: DC | PRN
  Administered 2020-09-30: 25 ug via INTRAVENOUS

## 2020-09-30 MED ORDER — PROPOFOL 10 MG/ML IV BOLUS
INTRAVENOUS | Status: DC | PRN
  Administered 2020-09-30 (×2): 30 mg via INTRAVENOUS

## 2020-09-30 MED ORDER — CEFAZOLIN SODIUM-DEXTROSE 2-4 GM/100ML-% IV SOLN
2.0000 g | INTRAVENOUS | Status: AC
  Administered 2020-09-30: 2 g via INTRAVENOUS

## 2020-09-30 MED ORDER — FENTANYL CITRATE (PF) 250 MCG/5ML IJ SOLN
INTRAMUSCULAR | Status: AC
  Filled 2020-09-30: qty 5

## 2020-09-30 SURGICAL SUPPLY — 51 items
ALCOHOL 70% 16 OZ (MISCELLANEOUS) ×2 IMPLANT
BIT DRILL 4.3MMS DISTAL GRDTED (BIT) ×1 IMPLANT
CHLORAPREP W/TINT 26 (MISCELLANEOUS) ×2 IMPLANT
COVER PERINEAL POST (MISCELLANEOUS) ×2 IMPLANT
COVER SURGICAL LIGHT HANDLE (MISCELLANEOUS) ×2 IMPLANT
COVER WAND RF STERILE (DRAPES) ×2 IMPLANT
DERMABOND ADHESIVE PROPEN (GAUZE/BANDAGES/DRESSINGS) ×2
DERMABOND ADVANCED (GAUZE/BANDAGES/DRESSINGS) ×2
DERMABOND ADVANCED .7 DNX12 (GAUZE/BANDAGES/DRESSINGS) ×2 IMPLANT
DERMABOND ADVANCED .7 DNX6 (GAUZE/BANDAGES/DRESSINGS) ×2 IMPLANT
DRAPE C-ARM 42X72 X-RAY (DRAPES) ×2 IMPLANT
DRAPE C-ARMOR (DRAPES) ×2 IMPLANT
DRAPE HALF SHEET 40X57 (DRAPES) ×2 IMPLANT
DRAPE IMP U-DRAPE 54X76 (DRAPES) ×4 IMPLANT
DRAPE STERI IOBAN 125X83 (DRAPES) ×2 IMPLANT
DRAPE U-SHAPE 47X51 STRL (DRAPES) ×4 IMPLANT
DRILL 4.3MMS DISTAL GRADUATED (BIT) ×2
DRSG AQUACEL AG ADV 3.5X 4 (GAUZE/BANDAGES/DRESSINGS) ×4 IMPLANT
DRSG MEPILEX BORDER 4X4 (GAUZE/BANDAGES/DRESSINGS) ×4 IMPLANT
ELECT REM PT RETURN 9FT ADLT (ELECTROSURGICAL) ×2
ELECTRODE REM PT RTRN 9FT ADLT (ELECTROSURGICAL) ×1 IMPLANT
FACESHIELD WRAPAROUND (MASK) ×2 IMPLANT
GLOVE BIO SURGEON STRL SZ8.5 (GLOVE) ×4 IMPLANT
GLOVE BIOGEL M 7.0 STRL (GLOVE) ×2 IMPLANT
GLOVE BIOGEL PI IND STRL 7.5 (GLOVE) ×1 IMPLANT
GLOVE BIOGEL PI IND STRL 8.5 (GLOVE) ×1 IMPLANT
GLOVE BIOGEL PI INDICATOR 7.5 (GLOVE) ×1
GLOVE BIOGEL PI INDICATOR 8.5 (GLOVE) ×1
GOWN STRL REUS W/ TWL LRG LVL3 (GOWN DISPOSABLE) ×2 IMPLANT
GOWN STRL REUS W/ TWL XL LVL3 (GOWN DISPOSABLE) ×1 IMPLANT
GOWN STRL REUS W/TWL 2XL LVL3 (GOWN DISPOSABLE) ×2 IMPLANT
GOWN STRL REUS W/TWL LRG LVL3 (GOWN DISPOSABLE) ×2
GOWN STRL REUS W/TWL XL LVL3 (GOWN DISPOSABLE) ×1
GUIDEPIN 3.2X17.5 THRD DISP (PIN) ×2 IMPLANT
GUIDEWIRE BALL NOSE 80CM (WIRE) ×2 IMPLANT
HFN LAG SCREW 10.5MM X 85MM (Screw) ×2 IMPLANT
KIT TURNOVER KIT B (KITS) ×2 IMPLANT
MANIFOLD NEPTUNE II (INSTRUMENTS) ×2 IMPLANT
MARKER SKIN DUAL TIP RULER LAB (MISCELLANEOUS) ×2 IMPLANT
NAIL HFN LH 125D 11X320 (Nail) ×2 IMPLANT
NS IRRIG 1000ML POUR BTL (IV SOLUTION) ×2 IMPLANT
PACK GENERAL/GYN (CUSTOM PROCEDURE TRAY) ×2 IMPLANT
PACK UNIVERSAL I (CUSTOM PROCEDURE TRAY) ×2 IMPLANT
PAD ARMBOARD 7.5X6 YLW CONV (MISCELLANEOUS) ×4 IMPLANT
SCREW BONE CORTICAL 5.0X36 (Screw) ×2 IMPLANT
SUT MNCRL AB 3-0 PS2 27 (SUTURE) ×2 IMPLANT
SUT MON AB 2-0 CT1 27 (SUTURE) ×2 IMPLANT
SUT VIC AB 1 CT1 27 (SUTURE) ×1
SUT VIC AB 1 CT1 27XBRD ANBCTR (SUTURE) ×1 IMPLANT
TOWEL GREEN STERILE (TOWEL DISPOSABLE) ×2 IMPLANT
TOWEL GREEN STERILE FF (TOWEL DISPOSABLE) ×2 IMPLANT

## 2020-09-30 NOTE — Plan of Care (Addendum)
At 1700 RN came to room, Pt oriented to self, time but disoriented to situation and place. Pt pulled out foley while balloon is inflated, noted a drop of blood on the sheet. Pt denies discomfort and pain. Pt's daughter came back to bedside and updated.    Problem: Clinical Measurements: Goal: Ability to maintain clinical measurements within normal limits will improve Outcome: Progressing   Problem: Activity: Goal: Risk for activity intolerance will decrease Outcome: Progressing   Problem: Nutrition: Goal: Adequate nutrition will be maintained Outcome: Progressing   Problem: Coping: Goal: Level of anxiety will decrease Outcome: Progressing   Problem: Elimination: Goal: Will not experience complications related to bowel motility Outcome: Progressing   Problem: Pain Managment: Goal: General experience of comfort will improve Outcome: Progressing   Problem: Safety: Goal: Ability to remain free from injury will improve Outcome: Progressing   Problem: Skin Integrity: Goal: Risk for impaired skin integrity will decrease Outcome: Progressing

## 2020-09-30 NOTE — ED Notes (Signed)
Pt placed on hospital bed, pillow provided, lights dimmed & purewick placed for comfort. No additional requests at this time.

## 2020-09-30 NOTE — Op Note (Signed)
OPERATIVE REPORT  SURGEON: Samson Frederic, MD   ASSISTANT: Barrie Dunker, PA-C.  PREOPERATIVE DIAGNOSIS: Left peritrochanteric femur fracture.   POSTOPERATIVE DIAGNOSIS: Left  peritrochantericfemur fracture.   PROCEDURE: Intramedullary fixation, Left femur.   IMPLANTS: Biomet Affixus Hip Fracture Nail, 11 by 320 mm, 125 degrees. 10.5 x 85 mm Hip Fracture Nail Lag Screw. 5 x 36 mm distal interlocking screw 1.  ANESTHESIA:  Spinal  ESTIMATED BLOOD LOSS:-50 mL    ANTIBIOTICS: 2 g Ancef.  DRAINS: None.  COMPLICATIONS: None.   CONDITION: PACU - hemodynamically stable.   BRIEF CLINICAL NOTE: Dawn Oneill is a 87 y.o. female who presented with an intertrochanteric femur fracture. The patient was admitted to the hospitalist service and underwent perioperative risk stratification and medical optimization. The risks, benefits, and alternatives to the procedure were explained, and the patient elected to proceed.  PROCEDURE IN DETAIL: Surgical site was marked by myself. The patient was taken to the operating room and anesthesia was induced on the bed. [facility] and the nonoperative lower extremity was scissored underneath the operative side. The fracture was reduced with traction, internal rotation, and adduction. The hip was prepped and draped in the normal sterile surgical fashion. Timeout was called verifying side and site of surgery. Preop antibiotics were given with 60 minutes of beginning the procedure.  Fluoroscopy was used to define the patient's anatomy. A 4 cm incision was made just proximal to the tip of the greater trochanter. The awl was used to obtain the standard starting point for a trochanteric entry nail under fluoroscopic control. The guidepin was placed. The entry reamer was used to open the proximal femur.  I placed the guidewire to the level of the physeal scar of the knee. I measured the length of the guidewire.  Sequential reaming was performed up to a size 12.5 mm with excellent chatter. Therefore, a size 11 by 320 mm nail was selected and assembled to the jig on the back table. The nail was placed without any difficulty. Through a separate stab incision, the cannula was placed down to the bone in preparation for the cephalomedullary device. A guidepin was placed into the femoral head using AP and lateral fluoroscopy views. The pin was measured, and then reaming was performed to the appropriate depth. The lag screw was inserted to the appropriate depth. The fracture was compressed through the jig. The setscrew was tightened and then loosened one quarter turn. Using perfect circle technique, a distal interlocking screw was placed. The jig was removed. Final AP and lateral fluoroscopy views were obtained to confirm fracture reduction and hardware placement. Tip apex distance was appropriate. There was no chondral penetration.  The wounds were copiously irrigated with saline. The wound was closed in layers with #1 Vicryl for the fascia, 2-0 Monocryl for the deep dermal layer, and skin staples. Glue was applied to the skin. Once the glue was fully hardened, sterile dressing was applied. The patient was then awakened from anesthesia and taken to the PACU in stable condition. Sponge needle and instrument counts were correct at the end of the case 2. There were no known complications.  We will readmit the patient to the hospitalist. Weightbearing status will be weightbearing as tolerated with a walker. We will begin Lovenox for DVT prophylaxis. The patient will work with physical therapy and undergo disposition planning.  Please note that a surgical assistant was a medical necessity for this procedure to perform it in a safe  and expeditious manner. Assistant was necessary to provide appropriate retraction of vital neurovascular structures, to prevent femoral fracture, and to allow for anatomic placement of the  prosthesis.

## 2020-09-30 NOTE — Discharge Instructions (Signed)
 Dr. Elbert Spickler Adult Hip & Knee Specialist [facility] [address] [place], [place] [address] [phone]   POSTOPERATIVE DIRECTIONS    Hip Rehabilitation, Guidelines Following Surgery   WEIGHT BEARING Weight bearing as tolerated with assist device (walker, cane, etc) as directed, use it as long as suggested by your surgeon or therapist, typically at least 4-6 weeks.   HOME CARE INSTRUCTIONS  Remove items at home which could result in a fall. This includes throw rugs or furniture in walking pathways.  Continue medications as instructed at time of discharge.  You may have some home medications which will be placed on hold until you complete the course of blood thinner medication.  4 days after discharge, you may start showering. No tub baths or soaking your incisions. Do not put on socks or shoes without following the instructions of your caregivers.   Sit on chairs with arms. Use the chair arms to help push yourself up when arising.  Arrange for the use of a toilet seat elevator so you are not sitting low.   Walk with walker as instructed.  You may resume a sexual relationship in one month or when given the OK by your caregiver.  Use walker as long as suggested by your caregivers.  Avoid periods of inactivity such as sitting longer than an hour when not asleep. This helps prevent blood clots.  You may return to work once you are cleared by your surgeon.  Do not drive a car for 6 weeks or until released by your surgeon.  Do not drive while taking narcotics.  Wear elastic stockings for two weeks following surgery during the day but you may remove then at night.  Make sure you keep all of your appointments after your operation with all of your doctors and caregivers. You should call the office at the above phone number and make an appointment for approximately two weeks after the date of your surgery. Please pick up a stool softener and laxative  for home use as long as you are requiring pain medications.  ICE to the affected hip every three hours for 30 minutes at a time and then as needed for pain and swelling. Continue to use ice on the hip for pain and swelling from surgery. You may notice swelling that will progress down to the foot and ankle.  This is normal after surgery.  Elevate the leg when you are not up walking on it.   It is important for you to complete the blood thinner medication as prescribed by your doctor.  Continue to use the breathing machine which will help keep your temperature down.  It is common for your temperature to cycle up and down following surgery, especially at night when you are not up moving around and exerting yourself.  The breathing machine keeps your lungs expanded and your temperature down.  RANGE OF MOTION AND STRENGTHENING EXERCISES  These exercises are designed to help you keep full movement of your hip joint. Follow your caregiver's or physical therapist's instructions. Perform all exercises about fifteen times, three times per day or as directed. Exercise both hips, even if you have had only one joint replacement. These exercises can be done on a training (exercise) mat, on the floor, on a table or on a bed. Use whatever works the best and is most comfortable for you. Use music or television while you are exercising so that the exercises are a pleasant break in your day. This   will make your life better with the exercises acting as a break in routine you can look forward to.  Lying on your back, slowly slide your foot toward your buttocks, raising your knee up off the floor. Then slowly slide your foot back down until your leg is straight again.  Lying on your back spread your legs as far apart as you can without causing discomfort.  Lying on your side, raise your upper leg and foot straight up from the floor as far as is comfortable. Slowly lower the leg and repeat.  Lying on your back, tighten up the  muscle in the front of your thigh (quadriceps muscles). You can do this by keeping your leg straight and trying to raise your heel off the floor. This helps strengthen the largest muscle supporting your knee.  Lying on your back, tighten up the muscles of your buttocks both with the legs straight and with the knee bent at a comfortable angle while keeping your heel on the floor.   SKILLED REHAB INSTRUCTIONS: If the patient is transferred to a skilled rehab facility following release from the hospital, a list of the current medications will be sent to the facility for the patient to continue.  When discharged from the skilled rehab facility, please have the facility set up the patient's Home Health Physical Therapy prior to being released. Also, the skilled facility will be responsible for providing the patient with their medications at time of release from the facility to include their pain medication and their blood thinner medication. If the patient is still at the rehab facility at time of the two week follow up appointment, the skilled rehab facility will also need to assist the patient in arranging follow up appointment in our office and any transportation needs.  MAKE SURE YOU:  Understand these instructions.  Will watch your condition.  Will get help right away if you are not doing well or get worse.  Pick up stool softner and laxative for home use following surgery while on pain medications. Daily dry dressing changes as needed. In 4 days, you may remove your dressings and begin taking showers - no tub baths or soaking the incisions. Continue to use ice for pain and swelling after surgery. Do not use any lotions or creams on the incision until instructed by your surgeon.   

## 2020-09-30 NOTE — Anesthesia Postprocedure Evaluation (Signed)
Anesthesia Post Note  Patient: Cameron Ali Nauman  Procedure(s) Performed: INTRAMEDULLARY (IM) NAIL INTERTROCHANTRIC (Left )     Patient location during evaluation: Nursing Unit Anesthesia Type: Spinal Level of consciousness: oriented and awake and alert Pain management: pain level controlled Vital Signs Assessment: post-procedure vital signs reviewed and stable Respiratory status: spontaneous breathing and respiratory function stable Cardiovascular status: blood pressure returned to baseline and stable Postop Assessment: no headache, no backache, no apparent nausea or vomiting and patient able to bend at knees Anesthetic complications: no   No complications documented.  Last Vitals:  Vitals:   09/30/20 1240 09/30/20 1312  BP: 116/72 (!) 118/57  Pulse: 80 82  Resp: (!) 25 17  Temp: (!) 36.2 C 36.6 C  SpO2: 94% 96%    Last Pain:  Vitals:   09/30/20 1312  TempSrc: Oral  PainSc: 0-No pain                 Trevor Iha

## 2020-09-30 NOTE — Progress Notes (Signed)
PROGRESS NOTE    Dawn Oneill  FIE:[id number] DOB: 14-Jan-1926 DOA: 09/29/2020 PCP: Patient, No Pcp Per   Chief Complaint  Patient presents with  . Hip Injury  . Fall  Brief Narrative: 87 year old female with a chronic microcytic anemia chronic back and knee pain carotid artery stenosis and AAA, hypothyroidism, hypertension, ongoing tobacco abuse presented to the ED with severe left hip pain and deformity after a fall at home when dog bumped into her and caused her to fall, without loss of consciousness. In the ED afebrile saturating well on room air vitals stable EKG sinus rhythm CT head negative for acute finding chest x-ray possible new pulmonary nodule with outpatient CT recommended, x-ray showed left hip fracture lab with significant leukocytosis anemia orthopedic was consulted and admitted for further management.  Subjective: Seen this am Awaiting for surgery in ED No chest pain or new complaints   Assessment & Plan:  Closed fracture of left hip: for OR today. DVT prophylaxis pain management will be deferred to orthopedic post op. Holding chemical prophylaxis preop.  Microcytic anemia,chronic, monitor closely.  Baseline 11.9 gm in may 2021. Recent Labs  Lab 09/29/20 1852  HGB 10.5*  HCT 33.6*   Leukocytosis: Patient afebrile.Unclear etiology could be reactive.No pneumonia on chest x-ray. ordered UA.  Will get perioperative Ancef. Recent Labs  Lab 09/29/20 1852  WBC 23.9*   Elevated TSH 9.5 check free T4  Lung nodule: Will need outpatient CT follow-up, if she desires to do so-will need to see her with PCP  Carotid artery stenosis/AAA asymptomatic followed by vascular surgery as outpatient.  Continue statin.  Essential hypertension: BP stable.resume home amlodipine  Anxiety/depression resume home Zoloft and low-dose Xanax  Nutrition: Diet Order             Diet NPO time specified Except for: Sips with Meds, Ice Chips  Diet effective midnight                    Nutrition Problem: Increased nutrient needs Etiology: post-op healing Signs/Symptoms: estimated needs Interventions: Ensure Enlive (each supplement provides 350kcal and 20 grams of protein),MVI  Body mass index is 20.12 kg/m.  DVT prophylaxis: SCDs Start: 09/29/20 2111 Code Status:   Code Status: Full Code  Family Communication: plan of care discussed with patient at bedside.  Status is: Inpatient Remains inpatient appropriate because:Inpatient level of care appropriate due to severity of illness and For hip fracture management  Dispo: The patient is from: Home lives with daughter              Anticipated d/c is to: SNF              Anticipated d/c date is: 2 days              Patient currently is not medically stable to d/c.   Difficult to place patient No Consultants:see note  Procedures:see note  Culture/Microbiology No results found for: SDES, SPECREQUEST, CULT, REPTSTATUS  Other culture-see note  Medications: Scheduled Meds: . [MAR Hold] amLODipine  10 mg Oral Daily  . [MAR Hold] atorvastatin  10 mg Oral Daily  . chlorhexidine  60 mL Topical Once  . [START ON 10/01/2020] feeding supplement  237 mL Oral BID BM  . [MAR Hold] gabapentin  100 mg Oral BID  . [MAR Hold] levothyroxine  75 mcg Oral Q0600  . [START ON 10/01/2020] multivitamin with minerals  1 tablet Oral Daily  . povidone-iodine  2 application Topical Once  . [  MAR Hold] sertraline  100 mg Oral Daily   Continuous Infusions:   Antimicrobials: Anti-infectives (From admission, onward)    Start     Dose/Rate Route Frequency Ordered Stop   09/30/20 0918  ceFAZolin (ANCEF) 2-4 GM/100ML-% IVPB       Note to Pharmacy: Payton EmeraldHudson, Leigh   : cabinet override      09/30/20 0918 09/30/20 1037   09/30/20 0915  ceFAZolin (ANCEF) IVPB 2g/100 mL premix        2 g 200 mL/hr over 30 Minutes Intravenous On call to O.R. 09/30/20 0911 09/30/20 1100      Objective: Vitals: Today's Vitals   09/30/20 0100 09/30/20  0200 09/30/20 0515 09/30/20 0815  BP: 130/71 138/73 138/71 132/83  Pulse: 89 94 88 (!) 101  Resp: 20 18 16 17   Temp:      TempSrc:      SpO2:  93% 91% 91%  Weight:      Height:      PainSc: 3        Intake/Output Summary (Last 24 hours) at 09/30/2020 1121 Last data filed at 09/30/2020 1112 Gross per 24 hour  Intake 1000 ml  Output 50 ml  Net 950 ml   Filed Weights   09/29/20 1840  Weight: 49.9 kg   Weight change:   Intake/Output from previous day: No intake/output data recorded. Intake/Output this shift: Total I/O In: 1000 [I.V.:800; IV Piggyback:200] Out: 50 [Blood:50] Filed Weights   09/29/20 1840  Weight: 49.9 kg    Examination: General exam: AAOx3, elderly, frail ,NAD, weak appearing. HEENT:Oral mucosa moist, Ear/Nose WNL grossly,dentition normal. Respiratory system: bilaterally clear, no crackles,no use of accessory muscle, non tender. Cardiovascular system: S1 & S2 +, regular, No JVD. Gastrointestinal system: Abdomen soft, NT,ND, BS+. Nervous System:Alert, awake, grossly nonfocal Extremities: left hip fractured site tender on exam No edema, distal peripheral pulses palpable.  Skin: No rashes,no icterus. MSK: Normal muscle bulk,tone, power   Data Reviewed: I have personally reviewed following labs and imaging studies CBC: Recent Labs  Lab 09/29/20 1852  WBC 23.9*  NEUTROABS 22.5*  HGB 10.5*  HCT 33.6*  MCV 62.7*  PLT 306   Basic Metabolic Panel: Recent Labs  Lab 09/29/20 1852  NA 136  K 3.8  CL 101  CO2 23  GLUCOSE 144*  BUN 10  CREATININE 0.76  CALCIUM 9.0   GFR: Estimated Creatinine Clearance: 35.3 mL/min (by C-G formula based on SCr of 0.76 mg/dL). Liver Function Tests: Recent Labs  Lab 09/29/20 1852  AST 21  ALT 14  ALKPHOS 83  BILITOT 1.2  PROT 6.2*  ALBUMIN 3.5   No results for input(s): LIPASE, AMYLASE in the last 168 hours. No results for input(s): AMMONIA in the last 168 hours. Coagulation Profile: Recent Labs  Lab  09/29/20 1852  INR 1.3*   Cardiac Enzymes: No results for input(s): CKTOTAL, CKMB, CKMBINDEX, TROPONINI in the last 168 hours. BNP (last 3 results) No results for input(s): PROBNP in the last 8760 hours. HbA1C: No results for input(s): HGBA1C in the last 72 hours. CBG: No results for input(s): GLUCAP in the last 168 hours. Lipid Profile: No results for input(s): CHOL, HDL, LDLCALC, TRIG, CHOLHDL, LDLDIRECT in the last 72 hours. Thyroid Function Tests: Recent Labs    09/29/20 2024  TSH 9.554*   Anemia Panel: No results for input(s): VITAMINB12, FOLATE, FERRITIN, TIBC, IRON, RETICCTPCT in the last 72 hours. Sepsis Labs: No results for input(s): PROCALCITON, LATICACIDVEN in the last 168  hours.  Recent Results (from the past 240 hour(s))  SARS Coronavirus 2 by RT PCR (hospital order, performed in [facility] hospital lab) Nasopharyngeal Nasopharyngeal Swab     Status: None   Collection Time: 09/29/20  6:52 PM   Specimen: Nasopharyngeal Swab  Result Value Ref Range Status   SARS Coronavirus 2 NEGATIVE NEGATIVE Final    Comment: (NOTE) SARS-CoV-2 target nucleic acids are NOT DETECTED.  The SARS-CoV-2 RNA is generally detectable in upper and lower respiratory specimens during the acute phase of infection. The lowest concentration of SARS-CoV-2 viral copies this assay can detect is 250 copies / mL. A negative result does not preclude SARS-CoV-2 infection and should not be used as the sole basis for treatment or other patient management decisions.  A negative result may occur with improper specimen collection / handling, submission of specimen other than nasopharyngeal swab, presence of viral mutation(s) within the areas targeted by this assay, and inadequate number of viral copies (<250 copies / mL). A negative result must be combined with clinical observations, patient history, and epidemiological information.  Fact Sheet for Patients:    BoilerBrush.com.cy  Fact Sheet for Healthcare Providers: https://pope.com/  This test is not yet approved or  cleared by the [place] FDA and has been authorized for detection and/or diagnosis of SARS-CoV-2 by FDA under an Emergency Use Authorization (EUA).  This EUA will remain in effect (meaning this test can be used) for the duration of the COVID-19 declaration under Section 564(b)(1) of the Act, 21 U.S.C. section 360bbb-3(b)(1), unless the authorization is terminated or revoked sooner.  Performed at [facility], [address], [place], [place] [address]      Radiology Studies: DG Chest 1 View  Result Date: 09/29/2020 CLINICAL DATA:  Pain status post fall. EXAM: CHEST  1 VIEW COMPARISON:  None. FINDINGS: The heart size is unremarkable. There are aortic calcifications. There is suggestion of a pulmonary nodule in the left mid lung zone. There is scarring and atelectasis at the lung bases. There is no pneumothorax. No large pleural effusion. No acute osseous abnormality. IMPRESSION: 1. Possible pulmonary nodule in the left mid lung zone. A follow-up outpatient CT of the chest is recommended for further evaluation. 2. Bibasilar scarring and atelectasis. 3. No acute cardiopulmonary process. Electronically Signed   By: Katherine Mantle M.D.   On: 09/29/2020 19:45   DG Pelvis 1-2 Views  Result Date: 09/29/2020 CLINICAL DATA:  Pain EXAM: PELVIS - 1-2 VIEW; LEFT FEMUR 2 VIEWS COMPARISON:  None. FINDINGS: There is an acute displaced, comminuted intratrochanteric fracture of the proximal left femur. There is osteopenia. Advanced vascular calcifications are noted. There are degenerative changes of both hips. There is no acute osseous abnormality involving the distal femur. Mild degenerative changes are noted of the knee. IMPRESSION: 1. Acute displaced, comminuted intratrochanteric fracture of the proximal left femur. 2. Osteopenia. 3.  Advanced vascular calcifications. Electronically Signed   By: Katherine Mantle M.D.   On: 09/29/2020 19:41   CT Head Wo Contrast  Result Date: 09/29/2020 CLINICAL DATA:  Fall.  Head and neck trauma. EXAM: CT HEAD WITHOUT CONTRAST CT CERVICAL SPINE WITHOUT CONTRAST TECHNIQUE: Multidetector CT imaging of the head and cervical spine was performed following the standard protocol without intravenous contrast. Multiplanar CT image reconstructions of the cervical spine were also generated. COMPARISON:  None. FINDINGS: CT HEAD FINDINGS Brain: Diffuse cerebral atrophy. Mild ventricular dilatation consistent with central atrophy. Low-attenuation changes in the deep white matter consistent with small  vessel ischemia. No mass-effect or midline shift. No abnormal extra-axial fluid collections. Gray-white matter junctions are distinct. Basal cisterns are not effaced. No acute intracranial hemorrhage. Vascular: Mild intracranial arterial vascular calcifications. Skull: Calvarium appears intact. Degenerative changes in the temporomandibular joints. Sinuses/Orbits: Paranasal sinuses and mastoid air cells are clear. Other: None. CT CERVICAL SPINE FINDINGS Alignment: Normal alignment. Skull base and vertebrae: Diffuse bone demineralization. Skull base appears intact. No vertebral compression deformities. No focal bone lesion or bone destruction. Bone cortex appears intact. Soft tissues and spinal canal: No prevertebral soft tissue swelling. No abnormal paraspinal soft tissue mass or infiltration. Disc levels: Degenerative changes throughout the cervical spine with narrowed interspaces and endplate hypertrophic changes throughout, most prominent at C4-5 and C5-6 levels. Degenerative changes throughout the facet joints. Upper chest: Emphysematous changes in the lung apices. Vascular calcifications. Calcified granulomas in the lung apices. Other: None. IMPRESSION: 1. No acute intracranial abnormalities. Chronic atrophy and small  vessel ischemic changes. 2. Normal alignment of the cervical spine. Diffuse bone demineralization. Degenerative changes throughout the cervical spine. No acute displaced fractures identified. 3. Emphysematous changes and calcified granulomas in the lung apices. Emphysema (ICD10-J43.9). Electronically Signed   By: Burman Nieves M.D.   On: 09/29/2020 19:55   CT Cervical Spine Wo Contrast  Result Date: 09/29/2020 CLINICAL DATA:  Fall.  Head and neck trauma. EXAM: CT HEAD WITHOUT CONTRAST CT CERVICAL SPINE WITHOUT CONTRAST TECHNIQUE: Multidetector CT imaging of the head and cervical spine was performed following the standard protocol without intravenous contrast. Multiplanar CT image reconstructions of the cervical spine were also generated. COMPARISON:  None. FINDINGS: CT HEAD FINDINGS Brain: Diffuse cerebral atrophy. Mild ventricular dilatation consistent with central atrophy. Low-attenuation changes in the deep white matter consistent with small vessel ischemia. No mass-effect or midline shift. No abnormal extra-axial fluid collections. Gray-white matter junctions are distinct. Basal cisterns are not effaced. No acute intracranial hemorrhage. Vascular: Mild intracranial arterial vascular calcifications. Skull: Calvarium appears intact. Degenerative changes in the temporomandibular joints. Sinuses/Orbits: Paranasal sinuses and mastoid air cells are clear. Other: None. CT CERVICAL SPINE FINDINGS Alignment: Normal alignment. Skull base and vertebrae: Diffuse bone demineralization. Skull base appears intact. No vertebral compression deformities. No focal bone lesion or bone destruction. Bone cortex appears intact. Soft tissues and spinal canal: No prevertebral soft tissue swelling. No abnormal paraspinal soft tissue mass or infiltration. Disc levels: Degenerative changes throughout the cervical spine with narrowed interspaces and endplate hypertrophic changes throughout, most prominent at C4-5 and C5-6 levels.  Degenerative changes throughout the facet joints. Upper chest: Emphysematous changes in the lung apices. Vascular calcifications. Calcified granulomas in the lung apices. Other: None. IMPRESSION: 1. No acute intracranial abnormalities. Chronic atrophy and small vessel ischemic changes. 2. Normal alignment of the cervical spine. Diffuse bone demineralization. Degenerative changes throughout the cervical spine. No acute displaced fractures identified. 3. Emphysematous changes and calcified granulomas in the lung apices. Emphysema (ICD10-J43.9). Electronically Signed   By: Burman Nieves M.D.   On: 09/29/2020 19:55   DG Femur Min 2 Views Left  Result Date: 09/29/2020 CLINICAL DATA:  Pain EXAM: PELVIS - 1-2 VIEW; LEFT FEMUR 2 VIEWS COMPARISON:  None. FINDINGS: There is an acute displaced, comminuted intratrochanteric fracture of the proximal left femur. There is osteopenia. Advanced vascular calcifications are noted. There are degenerative changes of both hips. There is no acute osseous abnormality involving the distal femur. Mild degenerative changes are noted of the knee. IMPRESSION: 1. Acute displaced, comminuted intratrochanteric fracture of the proximal left femur. 2.  Osteopenia. 3. Advanced vascular calcifications. Electronically Signed   By: Katherine Mantle M.D.   On: 09/29/2020 19:41     LOS: 1 day   Lanae Boast, MD [facility]  09/30/2020, 11:21 AM

## 2020-09-30 NOTE — Transfer of Care (Signed)
Immediate Anesthesia Transfer of Care Note  Patient: Dawn Oneill  Procedure(s) Performed: INTRAMEDULLARY (IM) NAIL INTERTROCHANTRIC (Left )  Patient Location: PACU  Anesthesia Type:MAC and Spinal  Level of Consciousness: awake, alert  and oriented  Airway & Oxygen Therapy: Patient Spontanous Breathing and Patient connected to face mask oxygen  Post-op Assessment: Report given to RN and Post -op Vital signs reviewed and stable  Post vital signs: Reviewed and stable  Last Vitals:  Vitals Value Taken Time  BP 85/46 09/30/20 1142  Temp    Pulse 83 09/30/20 1144  Resp 17 09/30/20 1144  SpO2 96 % 09/30/20 1144  Vitals shown include unvalidated device data.  Last Pain:  Vitals:   09/30/20 0100  TempSrc:   PainSc: 3          Complications: No complications documented.

## 2020-09-30 NOTE — Progress Notes (Signed)
Initial Nutrition Assessment  DOCUMENTATION CODES:   Not applicable  INTERVENTION:   -Once diet is advanced, add:   -Ensure Enlive po BID, each supplement provides 350 kcal and 20 grams of protein -MVI with minerals daily  NUTRITION DIAGNOSIS:   Increased nutrient needs related to post-op healing as evidenced by estimated needs.  GOAL:   Patient will meet greater than or equal to 90% of their needs  MONITOR:   PO intake,Supplement acceptance,Diet advancement,Labs,Weight trends,Skin,I & O's  REASON FOR ASSESSMENT:   Consult Assessment of nutrition requirement/status,Hip fracture protocol  ASSESSMENT:   Dawn Oneill is a 87 y.o. female with medical history significant for chronic microcytic anemia, chronic back and knee pain, carotid artery stenosis and AAA, hypothyroidism, hypertension, and ongoing tobacco abuse, now presenting to the emergency department for evaluation of severe left hip pain and deformity after a fall at home.  Patient reports that she was in her usual state of health when her dog bumped into her and caused her to fall.  She denies hitting her head or losing consciousness but experienced immediate and severe pain at the left hip and was unable to bear weight.  Patient reports that she ambulates unassisted normally, has a bedroom on a second floor, since flight of stairs multiple times daily without any significant dyspnea.  She never experiences chest pain or diaphoresis with exertion.  She lives with her daughter, no longer cooks, but does laundry and other housework and is generally independent.  She continues to smoke almost 1 pack/day.  Pt admitted with lt hp fracture s/p fall.   Per orthopedics notes, plan for intramedullary fixation of lt femur today. Pt is currently NPO for procedure.   No meal completion data to assess at this time.   No wt hx available to assess at this time.   Pt with increased nutritional needs for post-operative healing  and would greatly benefit from addition of oral nutrition supplements.   Labs reviewed.  Diet Order:   Diet Order            Diet NPO time specified Except for: Sips with Meds, Ice Chips  Diet effective midnight                 EDUCATION NEEDS:   No education needs have been identified at this time  Skin:  Skin Assessment: Reviewed RN Assessment  Last BM:  Unknown  Height:   Ht Readings from Last 1 Encounters:  09/29/20 5\' 2"  (1.575 m)    Weight:   Wt Readings from Last 1 Encounters:  09/29/20 49.9 kg    Ideal Body Weight:  50 kg  BMI:  Body mass index is 20.12 kg/m.  Estimated Nutritional Needs:   Kcal:  1300-1500  Protein:  60-75 grams  Fluid:  > 1.3 L    10/01/20, RD, LDN, CDCES Registered Dietitian II Certified Diabetes Care and Education Specialist Please refer to [facility] for RD and/or RD on-call/weekend/after hours pager

## 2020-09-30 NOTE — Anesthesia Preprocedure Evaluation (Addendum)
Anesthesia Evaluation  Patient identified by MRN, date of birth, ID band Patient awake    Reviewed: Allergy & Precautions, NPO status , Patient's Chart, lab work & pertinent test results  History of Anesthesia Complications Negative for: history of anesthetic complications  Airway Mallampati: II  TM Distance: >3 FB Neck ROM: Full    Dental no notable dental hx. (+) Dental Advisory Given, Upper Dentures, Lower Dentures   Pulmonary Current Smoker,    Pulmonary exam normal breath sounds clear to auscultation       Cardiovascular hypertension, Pt. on medications Normal cardiovascular exam Rhythm:Regular Rate:Normal     Neuro/Psych negative neurological ROS     GI/Hepatic negative GI ROS, Neg liver ROS,   Endo/Other  Hypothyroidism   Renal/GU negative Renal ROS     Musculoskeletal negative musculoskeletal ROS (+)   Abdominal   Peds  Hematology negative hematology ROS (+) anemia ,   Anesthesia Other Findings   Reproductive/Obstetrics                            Anesthesia Physical Anesthesia Plan  ASA: II  Anesthesia Plan: Spinal   Post-op Pain Management:    Induction:   PONV Risk Score and Plan: Ondansetron, Treatment may vary due to age or medical condition and Propofol infusion  Airway Management Planned: Natural Airway and Nasal Cannula  Additional Equipment: None  Intra-op Plan:   Post-operative Plan:   Informed Consent: I have reviewed the patients History and Physical, chart, labs and discussed the procedure including the risks, benefits and alternatives for the proposed anesthesia with the patient or authorized representative who has indicated his/her understanding and acceptance.     Dental advisory given  Plan Discussed with: Anesthesiologist and CRNA  Anesthesia Plan Comments:        Anesthesia Quick Evaluation

## 2020-09-30 NOTE — Anesthesia Procedure Notes (Signed)
Procedure Name: MAC Date/Time: 09/30/2020 10:15 AM Performed by: Inda Coke, CRNA Pre-anesthesia Checklist: Patient identified, Emergency Drugs available, Suction available, Timeout performed and Patient being monitored Patient Re-evaluated:Patient Re-evaluated prior to induction Oxygen Delivery Method: Simple face mask Induction Type: IV induction Dental Injury: Teeth and Oropharynx as per pre-operative assessment

## 2020-09-30 NOTE — TOC CAGE-AID Note (Signed)
Transition of Care [facility]) - CAGE-AID Screening   Patient Details  Name: Dawn Oneill MRN: [id number] Date of Birth: 16-Jul-1926   Contact:    Judie Bonus, RN Phone Number: 09/30/2020, 3:23 PM   Clinical Narrative:.  Patient reports she has been smoking cigarettes since 1950's and has no interest or intention of quitting at his point in her life. Resources offered.     CAGE-AID Screening: Substance Abuse Screening unable to be completed due to: : Patient Refused  Have You Ever Felt You Ought to Cut Down on Your Drinking or Drug Use?: No Have People Annoyed You By Critizing Your Drinking Or Drug Use?: No Have You Felt Bad Or Guilty About Your Drinking Or Drug Use?: No Have You Ever Had a Drink or Used Drugs First Thing In The Morning to Steady Your Nerves or to Get Rid of a Hangover?: No CAGE-AID Score: 0  Substance Abuse Education Offered: Yes  Substance abuse interventions: Patient Counseling

## 2020-09-30 NOTE — Anesthesia Procedure Notes (Signed)
Spinal  Patient location during procedure: OR Start time: 09/30/2020 10:16 AM End time: 09/30/2020 10:22 AM Staffing Performed: anesthesiologist  Anesthesiologist: Trevor Iha, MD Preanesthetic Checklist Completed: patient identified, IV checked, site marked, risks and benefits discussed, surgical consent, monitors and equipment checked, pre-op evaluation and timeout performed Spinal Block Patient position: right lateral decubitus Prep: DuraPrep Patient monitoring: heart rate, cardiac monitor, continuous pulse ox and blood pressure Approach: midline Location: L3-4 Injection technique: single-shot Needle Needle type: Sprotte  Needle gauge: 24 G Needle length: 9 cm Needle insertion depth: 6 cm Assessment Sensory level: T4

## 2020-09-30 NOTE — Consult Note (Signed)
ORTHOPAEDIC CONSULTATION  REQUESTING PHYSICIAN: Lanae Boast, MD  PCP:  Patient, No Pcp Per  Chief Complaint: Left hip injury  HPI: Dawn Oneill is a 87 y.o. female with a past medical history of chronic microcytic anemia, chronic back and knee pain, carotid artery stenosis, AAA, hypothyroidism, hypertension, and tobacco abuse who presents to the emergency department after a mechanical fall at home.  She fell onto her left hip.  She has left hip pain and inability to weight-bear.  Radiographs revealed a comminuted left peritrochanteric femur fracture.  She smokes 1 pack of cigarettes per day.  She denies other injuries.  She was admitted to the hospitalist for perioperative risk ratification medical optimization.  Past Medical History:  Diagnosis Date  . Anemia    History reviewed. No pertinent surgical history. Social History   Socioeconomic History  . Marital status: Unknown    Spouse name: Not on file  . Number of children: Not on file  . Years of education: Not on file  . Highest education level: Not on file  Occupational History  . Not on file  Tobacco Use  . Smoking status: Current Every Day Smoker    Packs/day: 1.00  . Smokeless tobacco: Never Used  Substance and Sexual Activity  . Alcohol use: Not on file  . Drug use: Not on file  . Sexual activity: Not on file  Other Topics Concern  . Not on file  Social History Narrative  . Not on file   Social Determinants of Health   Financial Resource Strain: Not on file  Food Insecurity: Not on file  Transportation Needs: Not on file  Physical Activity: Not on file  Stress: Not on file  Social Connections: Not on file   History reviewed. No pertinent family history. No Known Allergies Prior to Admission medications   Medication Sig Start Date End Date Taking? Authorizing Provider  ALPRAZolam Prudy Feeler) 0.5 MG tablet Take 0.25-0.5 mg by mouth 3 (three) times daily as needed for sleep. 09/12/20  Yes [provider]  amLODipine (NORVASC) 10 MG tablet Take 10 mg by mouth daily. 09/24/20  Yes [provider]  Artificial Tear Ointment (DRY EYES OP) Apply 1 drop to eye daily as needed (dry eyes).   Yes [provider]  aspirin 81 MG EC tablet Take 81 mg by mouth daily. 05/11/09  Yes [provider]  atorvastatin (LIPITOR) 10 MG tablet Take 10 mg by mouth daily. 09/16/20  Yes [provider]  Baclofen 5 MG TABS Take 5 mg by mouth daily. 09/05/20  Yes [provider]  candesartan (ATACAND) 32 MG tablet Take 32 mg by mouth daily. 09/24/20  Yes [provider]  gabapentin (NEURONTIN) 100 MG capsule Take 100 mg by mouth 2 (two) times daily. 09/24/20  Yes [provider]  levothyroxine (SYNTHROID) 75 MCG tablet Take 75 mcg by mouth daily. 09/24/20  Yes [provider]   DG Chest 1 View  Result Date: 09/29/2020 CLINICAL DATA:  Pain status post fall. EXAM: CHEST  1 VIEW COMPARISON:  None. FINDINGS: The heart size is unremarkable. There are aortic calcifications. There is suggestion of a pulmonary nodule in the left mid lung zone. There is scarring and atelectasis at the lung bases. There is no pneumothorax. No large pleural effusion. No acute osseous abnormality. IMPRESSION: 1. Possible pulmonary nodule in the left mid lung zone. A follow-up outpatient CT of the chest is recommended for further evaluation. 2. Bibasilar scarring and atelectasis.  3. No acute cardiopulmonary process. Electronically Signed   By: Katherine Mantle M.D.   On: 09/29/2020 19:45   DG Pelvis 1-2 Views  Result Date: 09/29/2020 CLINICAL DATA:  Pain EXAM: PELVIS - 1-2 VIEW; LEFT FEMUR 2 VIEWS COMPARISON:  None. FINDINGS: There is an acute displaced, comminuted intratrochanteric fracture of the proximal left femur. There is osteopenia. Advanced vascular calcifications are noted. There are degenerative changes of both hips. There is no acute osseous abnormality involving the  distal femur. Mild degenerative changes are noted of the knee. IMPRESSION: 1. Acute displaced, comminuted intratrochanteric fracture of the proximal left femur. 2. Osteopenia. 3. Advanced vascular calcifications. Electronically Signed   By: Katherine Mantle M.D.   On: 09/29/2020 19:41   CT Head Wo Contrast  Result Date: 09/29/2020 CLINICAL DATA:  Fall.  Head and neck trauma. EXAM: CT HEAD WITHOUT CONTRAST CT CERVICAL SPINE WITHOUT CONTRAST TECHNIQUE: Multidetector CT imaging of the head and cervical spine was performed following the standard protocol without intravenous contrast. Multiplanar CT image reconstructions of the cervical spine were also generated. COMPARISON:  None. FINDINGS: CT HEAD FINDINGS Brain: Diffuse cerebral atrophy. Mild ventricular dilatation consistent with central atrophy. Low-attenuation changes in the deep white matter consistent with small vessel ischemia. No mass-effect or midline shift. No abnormal extra-axial fluid collections. Gray-white matter junctions are distinct. Basal cisterns are not effaced. No acute intracranial hemorrhage. Vascular: Mild intracranial arterial vascular calcifications. Skull: Calvarium appears intact. Degenerative changes in the temporomandibular joints. Sinuses/Orbits: Paranasal sinuses and mastoid air cells are clear. Other: None. CT CERVICAL SPINE FINDINGS Alignment: Normal alignment. Skull base and vertebrae: Diffuse bone demineralization. Skull base appears intact. No vertebral compression deformities. No focal bone lesion or bone destruction. Bone cortex appears intact. Soft tissues and spinal canal: No prevertebral soft tissue swelling. No abnormal paraspinal soft tissue mass or infiltration. Disc levels: Degenerative changes throughout the cervical spine with narrowed interspaces and endplate hypertrophic changes throughout, most prominent at C4-5 and C5-6 levels. Degenerative changes throughout the facet joints. Upper chest: Emphysematous changes  in the lung apices. Vascular calcifications. Calcified granulomas in the lung apices. Other: None. IMPRESSION: 1. No acute intracranial abnormalities. Chronic atrophy and small vessel ischemic changes. 2. Normal alignment of the cervical spine. Diffuse bone demineralization. Degenerative changes throughout the cervical spine. No acute displaced fractures identified. 3. Emphysematous changes and calcified granulomas in the lung apices. Emphysema (ICD10-J43.9). Electronically Signed   By: Burman Nieves M.D.   On: 09/29/2020 19:55   CT Cervical Spine Wo Contrast  Result Date: 09/29/2020 CLINICAL DATA:  Fall.  Head and neck trauma. EXAM: CT HEAD WITHOUT CONTRAST CT CERVICAL SPINE WITHOUT CONTRAST TECHNIQUE: Multidetector CT imaging of the head and cervical spine was performed following the standard protocol without intravenous contrast. Multiplanar CT image reconstructions of the cervical spine were also generated. COMPARISON:  None. FINDINGS: CT HEAD FINDINGS Brain: Diffuse cerebral atrophy. Mild ventricular dilatation consistent with central atrophy. Low-attenuation changes in the deep white matter consistent with small vessel ischemia. No mass-effect or midline shift. No abnormal extra-axial fluid collections. Gray-white matter junctions are distinct. Basal cisterns are not effaced. No acute intracranial hemorrhage. Vascular: Mild intracranial arterial vascular calcifications. Skull: Calvarium appears intact. Degenerative changes in the temporomandibular joints. Sinuses/Orbits: Paranasal sinuses and mastoid air cells are clear. Other: None. CT CERVICAL SPINE FINDINGS Alignment: Normal alignment. Skull base and vertebrae: Diffuse bone demineralization. Skull base appears intact. No vertebral compression deformities. No focal bone lesion or bone destruction. Bone cortex appears intact. Soft tissues  and spinal canal: No prevertebral soft tissue swelling. No abnormal paraspinal soft tissue mass or infiltration.  Disc levels: Degenerative changes throughout the cervical spine with narrowed interspaces and endplate hypertrophic changes throughout, most prominent at C4-5 and C5-6 levels. Degenerative changes throughout the facet joints. Upper chest: Emphysematous changes in the lung apices. Vascular calcifications. Calcified granulomas in the lung apices. Other: None. IMPRESSION: 1. No acute intracranial abnormalities. Chronic atrophy and small vessel ischemic changes. 2. Normal alignment of the cervical spine. Diffuse bone demineralization. Degenerative changes throughout the cervical spine. No acute displaced fractures identified. 3. Emphysematous changes and calcified granulomas in the lung apices. Emphysema (ICD10-J43.9). Electronically Signed   By: Burman Nieves M.D.   On: 09/29/2020 19:55   DG Femur Min 2 Views Left  Result Date: 09/29/2020 CLINICAL DATA:  Pain EXAM: PELVIS - 1-2 VIEW; LEFT FEMUR 2 VIEWS COMPARISON:  None. FINDINGS: There is an acute displaced, comminuted intratrochanteric fracture of the proximal left femur. There is osteopenia. Advanced vascular calcifications are noted. There are degenerative changes of both hips. There is no acute osseous abnormality involving the distal femur. Mild degenerative changes are noted of the knee. IMPRESSION: 1. Acute displaced, comminuted intratrochanteric fracture of the proximal left femur. 2. Osteopenia. 3. Advanced vascular calcifications. Electronically Signed   By: Katherine Mantle M.D.   On: 09/29/2020 19:41    Positive ROS: All other systems have been reviewed and were otherwise negative with the exception of those mentioned in the HPI and as above.  Physical Exam: General: Alert, no acute distress Cardiovascular: No pedal edema Respiratory: No cyanosis, no use of accessory musculature GI: No organomegaly, abdomen is soft and non-tender Skin: No lesions in the area of chief complaint Neurologic: Sensation intact distally Psychiatric: Patient  is competent for consent with normal mood and affect Lymphatic: No axillary or cervical lymphadenopathy  MUSCULOSKELETAL: Examination of left hip reveals no skin wounds or lesions.  She is shortened and externally rotated.  Pain with attempted logrolling of the hip.  Positive motor function plantarflexion, dorsiflexion, and great toe extension.  She reports intact sensation.  Foot is perfused.  Assessment: Comminuted left peritrochanteric femur fracture. Tobacco abuse.  Plan: I discussed the findings with the patient.  She has a displaced unstable peritrochanteric femur fracture that requires surgical stabilization.  We discussed the risk, benefits, and alternatives to intramedullary fixation of left femur.  Plan for surgery today.  Recommend smoking cessation.  All questions were solicited and answered.  The risks, benefits, and alternatives were discussed with the patient. There are risks associated with the surgery including, but not limited to, problems with anesthesia (death), infection, differences in leg length/angulation/rotation, fracture of bones, loosening or failure of implants, malunion, nonunion, hematoma (blood accumulation) which may require surgical drainage, blood clots, pulmonary embolism, nerve injury (foot drop), and blood vessel injury. The patient understands these risks and elects to proceed.   Jonette Pesa, MD [phone]    09/30/2020 9:41 AM

## 2020-10-01 LAB — BASIC METABOLIC PANEL
Anion gap: 11 (ref 5–15)
BUN: 13 mg/dL (ref 8–23)
CO2: 22 mmol/L (ref 22–32)
Calcium: 8 mg/dL — ABNORMAL LOW (ref 8.9–10.3)
Chloride: 102 mmol/L (ref 98–111)
Creatinine, Ser: 0.77 mg/dL (ref 0.44–1.00)
GFR, Estimated: >60 mL/min (ref >60)
Glucose, Bld: 106 mg/dL — ABNORMAL HIGH (ref 70–99)
Potassium: 3.7 mmol/L (ref 3.5–5.1)
Sodium: 135 mmol/L (ref 135–145)

## 2020-10-01 LAB — PREPARE RBC (CROSSMATCH)

## 2020-10-01 LAB — CBC
HCT: 22.3 % — ABNORMAL LOW (ref 36.0–46.0)
Hemoglobin: 7.2 g/dL — ABNORMAL LOW (ref 12.0–15.0)
MCH: 20.3 pg — ABNORMAL LOW (ref 26.0–34.0)
MCHC: 32.3 g/dL (ref 30.0–36.0)
MCV: 63 fL — ABNORMAL LOW (ref 80.0–100.0)
Platelets: 187 10*3/uL (ref 150–400)
RBC: 3.54 MIL/uL — ABNORMAL LOW (ref 3.87–5.11)
RDW: 14.7 % (ref 11.5–15.5)
WBC: 12.1 10*3/uL — ABNORMAL HIGH (ref 4.0–10.5)
nRBC: 0 % (ref 0.0–0.2)

## 2020-10-01 MED ORDER — AMLODIPINE BESYLATE 5 MG PO TABS
5.0000 mg | ORAL_TABLET | Freq: Every day | ORAL | Status: DC
  Administered 2020-10-01 – 2020-10-05 (×5): 5 mg via ORAL
  Filled 2020-10-01 (×5): qty 1

## 2020-10-01 MED ORDER — SODIUM CHLORIDE 0.9% IV SOLUTION: Freq: Once | INTRAVENOUS | Status: AC

## 2020-10-01 NOTE — Plan of Care (Signed)

## 2020-10-01 NOTE — Plan of Care (Signed)

## 2020-10-01 NOTE — Progress Notes (Signed)
PROGRESS NOTE    Dawn Oneill  ZOX:[id number] DOB: [birth year] DOA: 09/29/2020 PCP: Patient, No Pcp Per   Chief Complaint  Patient presents with  . Hip Injury  . Fall  Brief Narrative: 87 year old female with a chronic microcytic anemia chronic back and knee pain carotid artery stenosis and AAA, hypothyroidism, hypertension, ongoing tobacco abuse presented to the ED with severe left hip pain and deformity after a fall at home when dog bumped into her and caused her to fall, without loss of consciousness. In the ED afebrile saturating well on room air vitals stable EKG sinus rhythm CT head negative for acute finding chest x-ray possible new pulmonary nodule with outpatient CT recommended, x-ray showed left hip fracture lab with significant leukocytosis anemia orthopedic was consulted and admitted for further management. 1/28- s/p intramedullary nail intertrochanteric on left.  Subjective: Seen and examined.   She is Up on the bedside chair.  Appears weak and frail. Overnight no fever, blood pressure stable, pain moderate  Assessment & Plan:  Closed fracture of left hip: Status post intertrochanteric intramedullary nailing 1/28 by orthopedics.  On Lovenox for DVT prophylaxis.  Continue PT OT.  Continue pain management and DVT prophylaxis outpatient/RX to Orthopedics  Microcytic anemia,chronic/history of thalassemia minor as per patient, with acute blood loss anemia due to #1 hemoglobin significantly dropped to 7.2 g.  Discussed with the patient risk benefits alternatives and she would like to proceed PRBC transfusion later today, will transfuse 1 unit PRBC at this time check H&H posttransfusion.Baseline 11.9 gm in may 2021. Recent Labs  Lab 09/29/20 1852 09/30/20 1434 10/01/20 0159  HGB 10.5* 7.8* 7.2*  HCT 33.6* 24.0* 22.3*   Leukocytosis: On admission likely reactive from #1 with leukocytosis significantly improved postop.No pneumonia on chest x-ray. ordered UA but not  available but of note she already received preop Ancef Recent Labs  Lab 09/29/20 1852 09/30/20 1434 10/01/20 0159  WBC 23.9* 13.5* 12.1*   Elevated TSH 9.5 free T4 1.1.  Continue her home Synthroid allergies dose if TSH remains elevated in next 2 to 3 weeks  Lung nodule: Will need outpatient CT follow-up, if she desires to do so-will need to see her with PCP  Carotid artery stenosis/AAA asymptomatic followed by vascular surgery as outpatient.  Continue high-dose statin.    Essential hypertension: BP is soft this morning decrease amlodipine to 5 mg and hold if blood pressure less than 110 systolic.  Monitor  Anxiety/depression cont home Zoloft and low-dose Xanax  Nutrition: Diet Order            Diet Heart Room service appropriate? Yes; Fluid consistency: Thin  Diet effective now                 Nutrition Problem: Increased nutrient needs Etiology: post-op healing Signs/Symptoms: estimated needs Interventions: Ensure Enlive (each supplement provides 350kcal and 20 grams of protein),MVI  Body mass index is 20.12 kg/m.  DVT prophylaxis: enoxaparin (LOVENOX) injection 40 mg Start: 10/01/20 0800 SCDs Start: 09/30/20 1309 Code Status:   Code Status: Full Code  Family Communication: plan of care discussed with patient at bedside.called her daughter's no but call went to pt's own phone no.  Status is: Inpatient Remains inpatient appropriate because:Inpatient level of care appropriate due to severity of illness and For hip fracture management  Dispo: The patient is from: Home lives with daughter              Anticipated d/c is to: SNF  Anticipated d/c date is: 2 days              Patient currently is not medically stable to d/c.   Difficult to place patient No Consultants:see note  Procedures:see note  Culture/Microbiology No results found for: SDES, SPECREQUEST, CULT, REPTSTATUS  Other culture-see note  Medications: Scheduled Meds: . amLODipine  10 mg Oral  Daily  . aspirin EC  81 mg Oral Daily  . atorvastatin  10 mg Oral Daily  . baclofen  5 mg Oral Daily  . docusate sodium  100 mg Oral BID  . enoxaparin (LOVENOX) injection  40 mg Subcutaneous Q24H  . feeding supplement  237 mL Oral BID BM  . gabapentin  100 mg Oral BID  . levothyroxine  75 mcg Oral Q0600  . senna  1 tablet Oral BID  . sertraline  100 mg Oral Daily   Continuous Infusions: . methocarbamol (ROBAXIN) IV      Antimicrobials: Anti-infectives (From admission, onward)   Start     Dose/Rate Route Frequency Ordered Stop   09/30/20 1315  ceFAZolin (ANCEF) IVPB 2g/100 mL premix        2 g 200 mL/hr over 30 Minutes Intravenous Every 6 hours 09/30/20 1308 09/30/20 2000   09/30/20 0918  ceFAZolin (ANCEF) 2-4 GM/100ML-% IVPB       Note to Pharmacy: Payton Emerald   : cabinet override      09/30/20 0918 09/30/20 1037   09/30/20 0915  ceFAZolin (ANCEF) IVPB 2g/100 mL premix        2 g 200 mL/hr over 30 Minutes Intravenous On call to O.R. 09/30/20 0911 09/30/20 1100     Objective: Vitals: Today's Vitals   09/30/20 2014 09/30/20 2301 09/30/20 2359 10/01/20 0500  BP: (!) 130/49   124/60  Pulse: 100   86  Resp: 16   17  Temp: 98.6 F (37 C)   98 F (36.7 C)  TempSrc:    Oral  SpO2: 96%   94%  Weight:      Height:      PainSc:  8  Asleep     Intake/Output Summary (Last 24 hours) at 10/01/2020 0758 Last data filed at 09/30/2020 2300 Gross per 24 hour  Intake 2235.76 ml  Output 250 ml  Net 1985.76 ml   Filed Weights   09/29/20 1840  Weight: 49.9 kg   Weight change:   Intake/Output from previous day: 01/28 0701 - 01/29 0700 In: 2235.8 [P.O.:480; I.V.:1210.8; IV Piggyback:544.9] Out: 250 [Urine:200; Blood:50] Intake/Output this shift: No intake/output data recorded. Filed Weights   09/29/20 1840  Weight: 49.9 kg    Examination: General exam: AAOx3, ill looking frail HEENT:Oral mucosa moist, Ear/Nose WNL grossly, dentition normal. Respiratory system:  bilaterally clear,no wheezing or crackles,no use of accessory muscle Cardiovascular system: S1 & S2 +, No JVD,. Gastrointestinal system: Abdomen soft, NT,ND, BS+ Nervous System:Alert, awake, moving extremities and grossly nonfocal Extremities: No edema, distal peripheral pulses palpable.  Left hip surgical site with Aquacel dressing in place intact and dry Skin: No rashes,no icterus. MSK: Normal muscle bulk,tone, power  Data Reviewed: I have personally reviewed following labs and imaging studies CBC: Recent Labs  Lab 09/29/20 1852 09/30/20 1434 10/01/20 0159  WBC 23.9* 13.5* 12.1*  NEUTROABS 22.5*  --   --   HGB 10.5* 7.8* 7.2*  HCT 33.6* 24.0* 22.3*  MCV 62.7* 63.0* 63.0*  PLT 306 214 187   Basic Metabolic Panel: Recent Labs  Lab 09/29/20 1852 09/30/20  1434 10/01/20 0159  NA 136 136 135  K 3.8 4.2 3.7  CL 101 103 102  CO2 23 25 22   GLUCOSE 144* 127* 106*  BUN 10 10 13   CREATININE 0.76 0.83 0.77  CALCIUM 9.0 8.0* 8.0*   GFR: Estimated Creatinine Clearance: 35.3 mL/min (by C-G formula based on SCr of 0.77 mg/dL). Liver Function Tests: Recent Labs  Lab 09/29/20 1852  AST 21  ALT 14  ALKPHOS 83  BILITOT 1.2  PROT 6.2*  ALBUMIN 3.5   No results for input(s): LIPASE, AMYLASE in the last 168 hours. No results for input(s): AMMONIA in the last 168 hours. Coagulation Profile: Recent Labs  Lab 09/29/20 1852  INR 1.3*   Cardiac Enzymes: No results for input(s): CKTOTAL, CKMB, CKMBINDEX, TROPONINI in the last 168 hours. BNP (last 3 results) No results for input(s): PROBNP in the last 8760 hours. HbA1C: No results for input(s): HGBA1C in the last 72 hours. CBG: No results for input(s): GLUCAP in the last 168 hours. Lipid Profile: No results for input(s): CHOL, HDL, LDLCALC, TRIG, CHOLHDL, LDLDIRECT in the last 72 hours. Thyroid Function Tests: Recent Labs    09/29/20 2024 09/30/20 1434  TSH 9.554*  --   FREET4  --  1.11   Anemia Panel: No results for  input(s): VITAMINB12, FOLATE, FERRITIN, TIBC, IRON, RETICCTPCT in the last 72 hours. Sepsis Labs: No results for input(s): PROCALCITON, LATICACIDVEN in the last 168 hours.  Recent Results (from the past 240 hour(s))  SARS Coronavirus 2 by RT PCR (hospital order, performed in [facility] hospital lab) Nasopharyngeal Nasopharyngeal Swab     Status: None   Collection Time: 09/29/20  6:52 PM   Specimen: Nasopharyngeal Swab  Result Value Ref Range Status   SARS Coronavirus 2 NEGATIVE NEGATIVE Final    Comment: (NOTE) SARS-CoV-2 target nucleic acids are NOT DETECTED.  The SARS-CoV-2 RNA is generally detectable in upper and lower respiratory specimens during the acute phase of infection. The lowest concentration of SARS-CoV-2 viral copies this assay can detect is 250 copies / mL. A negative result does not preclude SARS-CoV-2 infection and should not be used as the sole basis for treatment or other patient management decisions.  A negative result may occur with improper specimen collection / handling, submission of specimen other than nasopharyngeal swab, presence of viral mutation(s) within the areas targeted by this assay, and inadequate number of viral copies (<250 copies / mL). A negative result must be combined with clinical observations, patient history, and epidemiological information.  Fact Sheet for Patients:   BoilerBrush.com.cy  Fact Sheet for Healthcare Providers: https://pope.com/  This test is not yet approved or  cleared by the [place] FDA and has been authorized for detection and/or diagnosis of SARS-CoV-2 by FDA under an Emergency Use Authorization (EUA).  This EUA will remain in effect (meaning this test can be used) for the duration of the COVID-19 declaration under Section 564(b)(1) of the Act, 21 U.S.C. section 360bbb-3(b)(1), unless the authorization is terminated or revoked sooner.  Performed at [facility], [address], [place], [place] [address]      Radiology Studies: DG Chest 1 View  Result Date: 09/29/2020 CLINICAL DATA:  Pain status post fall. EXAM: CHEST  1 VIEW COMPARISON:  None. FINDINGS: The heart size is unremarkable. There are aortic calcifications. There is suggestion of a pulmonary nodule in the left mid lung zone. There is scarring and atelectasis at the lung bases. There is no pneumothorax. No  large pleural effusion. No acute osseous abnormality. IMPRESSION: 1. Possible pulmonary nodule in the left mid lung zone. A follow-up outpatient CT of the chest is recommended for further evaluation. 2. Bibasilar scarring and atelectasis. 3. No acute cardiopulmonary process. Electronically Signed   By: Katherine Mantle M.D.   On: 09/29/2020 19:45   DG Pelvis 1-2 Views  Result Date: 09/29/2020 CLINICAL DATA:  Pain EXAM: PELVIS - 1-2 VIEW; LEFT FEMUR 2 VIEWS COMPARISON:  None. FINDINGS: There is an acute displaced, comminuted intratrochanteric fracture of the proximal left femur. There is osteopenia. Advanced vascular calcifications are noted. There are degenerative changes of both hips. There is no acute osseous abnormality involving the distal femur. Mild degenerative changes are noted of the knee. IMPRESSION: 1. Acute displaced, comminuted intratrochanteric fracture of the proximal left femur. 2. Osteopenia. 3. Advanced vascular calcifications. Electronically Signed   By: Katherine Mantle M.D.   On: 09/29/2020 19:41   CT Head Wo Contrast  Result Date: 09/29/2020 CLINICAL DATA:  Fall.  Head and neck trauma. EXAM: CT HEAD WITHOUT CONTRAST CT CERVICAL SPINE WITHOUT CONTRAST TECHNIQUE: Multidetector CT imaging of the head and cervical spine was performed following the standard protocol without intravenous contrast. Multiplanar CT image reconstructions of the cervical spine were also generated. COMPARISON:  None. FINDINGS: CT HEAD FINDINGS Brain: Diffuse cerebral atrophy. Mild  ventricular dilatation consistent with central atrophy. Low-attenuation changes in the deep white matter consistent with small vessel ischemia. No mass-effect or midline shift. No abnormal extra-axial fluid collections. Gray-white matter junctions are distinct. Basal cisterns are not effaced. No acute intracranial hemorrhage. Vascular: Mild intracranial arterial vascular calcifications. Skull: Calvarium appears intact. Degenerative changes in the temporomandibular joints. Sinuses/Orbits: Paranasal sinuses and mastoid air cells are clear. Other: None. CT CERVICAL SPINE FINDINGS Alignment: Normal alignment. Skull base and vertebrae: Diffuse bone demineralization. Skull base appears intact. No vertebral compression deformities. No focal bone lesion or bone destruction. Bone cortex appears intact. Soft tissues and spinal canal: No prevertebral soft tissue swelling. No abnormal paraspinal soft tissue mass or infiltration. Disc levels: Degenerative changes throughout the cervical spine with narrowed interspaces and endplate hypertrophic changes throughout, most prominent at C4-5 and C5-6 levels. Degenerative changes throughout the facet joints. Upper chest: Emphysematous changes in the lung apices. Vascular calcifications. Calcified granulomas in the lung apices. Other: None. IMPRESSION: 1. No acute intracranial abnormalities. Chronic atrophy and small vessel ischemic changes. 2. Normal alignment of the cervical spine. Diffuse bone demineralization. Degenerative changes throughout the cervical spine. No acute displaced fractures identified. 3. Emphysematous changes and calcified granulomas in the lung apices. Emphysema (ICD10-J43.9). Electronically Signed   By: Burman Nieves M.D.   On: 09/29/2020 19:55   CT Cervical Spine Wo Contrast  Result Date: 09/29/2020 CLINICAL DATA:  Fall.  Head and neck trauma. EXAM: CT HEAD WITHOUT CONTRAST CT CERVICAL SPINE WITHOUT CONTRAST TECHNIQUE: Multidetector CT imaging of the head  and cervical spine was performed following the standard protocol without intravenous contrast. Multiplanar CT image reconstructions of the cervical spine were also generated. COMPARISON:  None. FINDINGS: CT HEAD FINDINGS Brain: Diffuse cerebral atrophy. Mild ventricular dilatation consistent with central atrophy. Low-attenuation changes in the deep white matter consistent with small vessel ischemia. No mass-effect or midline shift. No abnormal extra-axial fluid collections. Gray-white matter junctions are distinct. Basal cisterns are not effaced. No acute intracranial hemorrhage. Vascular: Mild intracranial arterial vascular calcifications. Skull: Calvarium appears intact. Degenerative changes in the temporomandibular joints. Sinuses/Orbits: Paranasal sinuses and mastoid air cells are clear. Other: None.  CT CERVICAL SPINE FINDINGS Alignment: Normal alignment. Skull base and vertebrae: Diffuse bone demineralization. Skull base appears intact. No vertebral compression deformities. No focal bone lesion or bone destruction. Bone cortex appears intact. Soft tissues and spinal canal: No prevertebral soft tissue swelling. No abnormal paraspinal soft tissue mass or infiltration. Disc levels: Degenerative changes throughout the cervical spine with narrowed interspaces and endplate hypertrophic changes throughout, most prominent at C4-5 and C5-6 levels. Degenerative changes throughout the facet joints. Upper chest: Emphysematous changes in the lung apices. Vascular calcifications. Calcified granulomas in the lung apices. Other: None. IMPRESSION: 1. No acute intracranial abnormalities. Chronic atrophy and small vessel ischemic changes. 2. Normal alignment of the cervical spine. Diffuse bone demineralization. Degenerative changes throughout the cervical spine. No acute displaced fractures identified. 3. Emphysematous changes and calcified granulomas in the lung apices. Emphysema (ICD10-J43.9). Electronically Signed   By:  Burman Nieves M.D.   On: 09/29/2020 19:55   Pelvis Portable  Result Date: 09/30/2020 CLINICAL DATA:  Left femur fracture ORIF. EXAM: PORTABLE PELVIS 1-2 VIEWS COMPARISON:  Pelvic radiographs 09/29/2020. FINDINGS: Two AP views of the pelvis demonstrate interval left femoral intramedullary nail and dynamic screw fixation of the comminuted intertrochanteric and subtrochanteric fracture. There is near anatomic reduction of the main fracture fragments. No complications are identified. Moderate underlying degenerative changes of both hips are noted. There are diffuse vascular calcifications status post aortobifemoral stent grafting. IMPRESSION: Near anatomic reduction of the comminuted intertrochanteric and subtrochanteric left femoral fracture post ORIF. Electronically Signed   By: Carey Bullocks M.D.   On: 09/30/2020 13:37   DG C-Arm 1-60 Min  Result Date: 09/30/2020 CLINICAL DATA:  Left femur fracture ORIF. EXAM: DG C-ARM 1-60 MIN; LEFT FEMUR 2 VIEWS COMPARISON:  Pelvic and left femur radiographs 09/29/2020 FINDINGS: C-arm fluoroscopy was provided in the operating room. 1 minutes and 17 seconds fluoroscopy time. Six spot fluoroscopic images are submitted. These demonstrate the placement of a left femoral intramedullary nail and dynamic femoral neck screw with near anatomic reduction the proximal femur fracture. The hardware is well positioned. There is a distal interlocking screw. No complications are identified. IMPRESSION: Intraoperative views during ORIF of proximal left femur fracture. No demonstrated complications. Electronically Signed   By: Carey Bullocks M.D.   On: 09/30/2020 13:41   DG FEMUR MIN 2 VIEWS LEFT  Result Date: 09/30/2020 CLINICAL DATA:  Left femur fracture ORIF. EXAM: DG C-ARM 1-60 MIN; LEFT FEMUR 2 VIEWS COMPARISON:  Pelvic and left femur radiographs 09/29/2020 FINDINGS: C-arm fluoroscopy was provided in the operating room. 1 minutes and 17 seconds fluoroscopy time. Six spot  fluoroscopic images are submitted. These demonstrate the placement of a left femoral intramedullary nail and dynamic femoral neck screw with near anatomic reduction the proximal femur fracture. The hardware is well positioned. There is a distal interlocking screw. No complications are identified. IMPRESSION: Intraoperative views during ORIF of proximal left femur fracture. No demonstrated complications. Electronically Signed   By: Carey Bullocks M.D.   On: 09/30/2020 13:41   DG Femur Min 2 Views Left  Result Date: 09/29/2020 CLINICAL DATA:  Pain EXAM: PELVIS - 1-2 VIEW; LEFT FEMUR 2 VIEWS COMPARISON:  None. FINDINGS: There is an acute displaced, comminuted intratrochanteric fracture of the proximal left femur. There is osteopenia. Advanced vascular calcifications are noted. There are degenerative changes of both hips. There is no acute osseous abnormality involving the distal femur. Mild degenerative changes are noted of the knee. IMPRESSION: 1. Acute displaced, comminuted intratrochanteric fracture of the proximal  left femur. 2. Osteopenia. 3. Advanced vascular calcifications. Electronically Signed   By: Katherine Mantle M.D.   On: 09/29/2020 19:41   DG FEMUR PORT MIN 2 VIEWS LEFT  Result Date: 09/30/2020 CLINICAL DATA:  Fracture of proximal left femur. EXAM: LEFT FEMUR PORTABLE 2 VIEWS COMPARISON:  09/29/2020 FINDINGS: Postoperative changes from open reduction and internal fixation of the comminuted intertrochanteric fracture involving the proximal right femur. There has been placement of hip screw and IM nail. Hardware components are in anatomic alignment. Mild lateral displacement of the greater trochanter noted. IMPRESSION: 1. Status post ORIF of proximal left femur fracture. Electronically Signed   By: Signa Kell M.D.   On: 09/30/2020 12:02     LOS: 2 days   Lanae Boast, MD [facility]  10/01/2020, 7:58 AM

## 2020-10-01 NOTE — Evaluation (Signed)
Physical Therapy Evaluation Patient Details Name: Dawn Oneill MRN: [id number] DOB: [birth year] Today's Date: 10/01/2020   History of Present Illness  87 y.o. female with a past medical history of chronic microcytic anemia, chronic back and knee pain, carotid artery stenosis, AAA, hypothyroidism, hypertension, and tobacco abuse who presented to the emergency department after a mechanical fall at home. Patient sustained comminuted L peritrochanteric femur fx. Patient s/p IM nail of L femur on 1/28  Clinical Impression  PTA, patient lives with daughter and reports needed some assistance. Patient oriented to self this session. Patient presents with generalized weakness, impaired balance, decreased activity tolerance, impaired cognition, and impaired functional mobility. Patient modA+2 for bed mobility and modA for sit to stand transfer. Patient ambulated Oneill distance to recliner with min guard and RW, cues for RW management and sequencing. Patient will benefit from skilled PT services during acute stay to address listed deficits. Recommend SNF following discharge to maximize functional mobility and safety.     Follow Up Recommendations SNF;Supervision/Assistance - 24 hour    Equipment Recommendations  Other (comment) (defer to post acute rehab)    Recommendations for Other Services       Precautions / Restrictions Precautions Precautions: Fall Restrictions Weight Bearing Restrictions: No LLE Weight Bearing: Weight bearing as tolerated      Mobility  Bed Mobility Overal bed mobility: Needs Assistance Bed Mobility: Supine to Sit     Supine to sit: Mod assist;+2 for physical assistance;+2 for safety/equipment     General bed mobility comments: assist required for LLE and trunk advancement to EOB, cues for use of bedrails to assist    Transfers Overall transfer level: Needs assistance Equipment used: Rolling Digna Countess (2 wheeled) Transfers: Sit to/from Stand Sit to Stand:  Mod assist         General transfer comment: max cues for foot placement, keeping toes down when stanidng, and hand placement  Ambulation/Gait Ambulation/Gait assistance: Min guard Gait Distance (Feet): 4 Feet Assistive device: Rolling Hillary Struss (2 wheeled) Gait Pattern/deviations: Step-to pattern;Decreased stride length;Decreased stance time - left;Antalgic Gait velocity: decreased   General Gait Details: ambulated to recliner, cues for RW management and sequencing  Stairs            Wheelchair Mobility    Modified Rankin (Stroke Patients Only)       Balance Overall balance assessment: Needs assistance Sitting-balance support: Bilateral upper extremity supported;Feet supported Sitting balance-Leahy Scale: Poor Sitting balance - Comments: initially minA to maintain sitting balance progressed to close supervision Postural control: Posterior lean Standing balance support: Bilateral upper extremity supported;During functional activity Standing balance-Leahy Scale: Poor Standing balance comment: reliant on UE support                             Pertinent Vitals/Pain Pain Assessment: Faces Faces Pain Scale: Hurts even more Pain Location: stated RLE, LLE, R UE at different times during the session Pain Descriptors / Indicators: Grimacing;Guarding Pain Intervention(s): Monitored during session;Repositioned    Home Living Family/patient expects to be discharged to:: Private residence Living Arrangements: Spouse/significant other (daughter denya) Available Help at Discharge: Family Type of Home: House Home Access: Stairs to enter Entrance Stairs-Rails:  (unsure) Entrance Stairs-Number of Steps: 4 or 6 Home Layout: Multi-level Home Equipment: Reaghan Kawa - 2 wheels;Bedside commode Additional Comments: pt decrease orientation    Prior Function Level of Independence: Needs assistance (per pt reported that they needed some assist)  Hand  Dominance        Extremity/Trunk Assessment   Upper Extremity Assessment Upper Extremity Assessment: Defer to OT evaluation    Lower Extremity Assessment Lower Extremity Assessment: Generalized weakness       Communication   Communication: No difficulties  Cognition Arousal/Alertness: Awake/alert Behavior During Therapy: WFL for tasks assessed/performed Overall Cognitive Status: Impaired/Different from baseline Area of Impairment: Orientation;Memory;Following commands;Safety/judgement;Awareness;Problem solving                 Orientation Level: Disoriented to;Place;Time;Situation   Memory: Decreased Oneill-term memory;Decreased recall of precautions Following Commands: Follows one step commands with increased time;Follows multi-step commands inconsistently Safety/Judgement: Decreased awareness of deficits Awareness: Intellectual Problem Solving: Requires verbal cues;Requires tactile cues;Slow processing        General Comments      Exercises     Assessment/Plan    PT Assessment Patient needs continued PT services  PT Problem List Decreased balance;Decreased activity tolerance;Decreased range of motion;Decreased strength;Decreased mobility;Decreased safety awareness       PT Treatment Interventions DME instruction;Functional mobility training;Gait training;Therapeutic exercise;Therapeutic activities;Balance training;Patient/family education    PT Goals (Current goals can be found in the Care Plan section)  Acute Rehab PT Goals Patient Stated Goal: did not state PT Goal Formulation: Patient unable to participate in goal setting Time For Goal Achievement: 10/15/20 Potential to Achieve Goals: Fair    Frequency Min 3X/week   Barriers to discharge        Co-evaluation               AM-PAC PT "6 Clicks" Mobility  Outcome Measure Help needed turning from your back to your side while in a flat bed without using bedrails?: A Lot Help needed moving  from lying on your back to sitting on the side of a flat bed without using bedrails?: A Lot Help needed moving to and from a bed to a chair (including a wheelchair)?: A Lot Help needed standing up from a chair using your arms (e.g., wheelchair or bedside chair)?: A Lot Help needed to walk in hospital room?: A Little Help needed climbing 3-5 steps with a railing? : A Lot 6 Click Score: 13    End of Session Equipment Utilized During Treatment: Gait belt Activity Tolerance: Patient tolerated treatment well Patient left: in chair;with call bell/phone within reach;with chair alarm set Nurse Communication: Mobility status PT Visit Diagnosis: Unsteadiness on feet (R26.81);Muscle weakness (generalized) (M62.81);History of falling (Z91.81);Other abnormalities of gait and mobility (R26.89)    Time: 1610-9604 PT Time Calculation (min) (ACUTE ONLY): 27 min   Charges:   PT Evaluation $PT Eval Moderate Complexity: 1 Mod          Valentino Saavedra A. Dan Humphreys PT, DPT Acute Rehabilitation Services Pager [phone] Office [phone]   Dawn Oneill 10/01/2020, 10:28 AM

## 2020-10-01 NOTE — Progress Notes (Signed)
1 st unit of PRBCs started- The patient and her daughter at the bedside was educated regarding blood transfusion.

## 2020-10-01 NOTE — Plan of Care (Signed)

## 2020-10-01 NOTE — Progress Notes (Signed)
   Subjective: 1 Day Post-Op Procedure(s) (LRB): INTRAMEDULLARY (IM) NAIL INTERTROCHANTRIC (Left)  C/o mild pain to left hip and leg Denies any new symptoms or issues overnight Otherwise doing fair Patient reports pain as moderate.  Objective:   VITALS:   Vitals:   09/30/20 2014 10/01/20 0500  BP: (!) 130/49 124/60  Pulse: 100 86  Resp: 16 17  Temp: 98.6 F (37 C) 98 F (36.7 C)  SpO2: 96% 94%    Left hip/thigh incisions healing well Dressings in place No drainage or signs of erythema nv intact distally   LABS Recent Labs    09/29/20 1852 09/30/20 1434 10/01/20 0159  HGB 10.5* 7.8* 7.2*  HCT 33.6* 24.0* 22.3*  WBC 23.9* 13.5* 12.1*  PLT 306 214 187    Recent Labs    09/29/20 1852 09/30/20 1434 10/01/20 0159  NA 136 136 135  K 3.8 4.2 3.7  BUN 10 10 13   CREATININE 0.76 0.83 0.77  GLUCOSE 144* 127* 106*     Assessment/Plan: 1 Day Post-Op Procedure(s) (LRB): INTRAMEDULLARY (IM) NAIL INTERTROCHANTRIC (Left) Acute blood loss anemia - will defer to medical team for transfusion PT/OT as able Pain management Pulmonary toilet D/c planning Will continue to monitor her progress   , [facility] is now [facility] [address], [place], [place] Phone: [phone] [email or web address] Facebook  [phone]

## 2020-10-01 NOTE — Evaluation (Signed)
Occupational Therapy Evaluation Patient Details Name: Dawn Oneill MRN: [id number] DOB: March 16, 1926 Today's Date: 10/01/2020    History of Present Illness 87 y.o. female with a past medical history of chronic microcytic anemia, chronic back and knee pain, carotid artery stenosis, AAA, hypothyroidism, hypertension, and tobacco abuse who presented to the emergency department after a mechanical fall at home. Patient sustained comminuted L peritrochanteric femur fx. Patient s/p IM nail of L femur on 1/28   Clinical Impression   Pt admitted due to s/p fall. Pt at this time reporting they are home in their bedroom. Pt at this time requires max cues for safety and sequencing of tasks. Pt at this time required moderate assistx 2 for bed mobility for and moderate assist with RW to complete room distance ambulation. Pt requires min guard for UE dressing on unsupportive surfaces and moderate to max assist with LE ADLS.  Pt currently with functional limitations due to the deficits listed below (see OT Problem List).  Pt will benefit from skilled OT to increase their safety and independence with ADL and functional mobility for ADL to facilitate discharge to venue listed below.       Follow Up Recommendations  SNF;Supervision/Assistance - 24 hour    Equipment Recommendations       Recommendations for Other Services       Precautions / Restrictions Precautions Precautions: Fall Restrictions Weight Bearing Restrictions: No LLE Weight Bearing: Weight bearing as tolerated      Mobility Bed Mobility Overal bed mobility: Needs Assistance Bed Mobility: Supine to Sit     Supine to sit: Mod assist;+2 for physical assistance;+2 for safety/equipment     General bed mobility comments: assist required for LLE and trunk advancement to EOB, cues for use of bedrails to assist    Transfers Overall transfer level: Needs assistance Equipment used: Rolling walker (2 wheeled) Transfers: Sit to/from  Stand Sit to Stand: Mod assist         General transfer comment: max cues for foot placement, keeping toes down when stanidng, and hand placement    Balance Overall balance assessment: Needs assistance Sitting-balance support: Bilateral upper extremity supported;Feet supported Sitting balance-Leahy Scale: Poor Sitting balance - Comments: initially minA to maintain sitting balance progressed to close supervision Postural control: Posterior lean Standing balance support: Bilateral upper extremity supported;During functional activity Standing balance-Leahy Scale: Poor Standing balance comment: reliant on UE support                           ADL either performed or assessed with clinical judgement   ADL Overall ADL's : Needs assistance/impaired Eating/Feeding: Independent;Sitting   Grooming: Wash/dry hands;Sitting;Set up   Upper Body Bathing: Sitting;Cueing for safety;Cueing for sequencing;Minimal assistance   Lower Body Bathing: Maximal assistance;Sit to/from stand;Cueing for safety;Cueing for sequencing   Upper Body Dressing : Min guard;Sitting   Lower Body Dressing: Maximal assistance;Sit to/from stand   Toilet Transfer: Moderate assistance;RW   Toileting- Clothing Manipulation and Hygiene: Moderate assistance;Cueing for safety;Cueing for sequencing;Sit to/from stand   Tub/ Engineer, structural: Maximal assistance;Rolling walker   Functional mobility during ADLs: Moderate assistance;Rolling walker General ADL Comments: pt needs cues for saftey     Vision         Perception Perception Perception Tested?: No   Praxis Praxis Praxis tested?: Not tested    Pertinent Vitals/Pain Pain Assessment: Faces Faces Pain Scale: Hurts even more Pain Location: stated RLE, LLE, R UE at different times during  the session Pain Descriptors / Indicators: Grimacing;Guarding Pain Intervention(s): Limited activity within patient's tolerance;Monitored during session;Repositioned      Hand Dominance     Extremity/Trunk Assessment Upper Extremity Assessment Upper Extremity Assessment: Generalized weakness   Lower Extremity Assessment Lower Extremity Assessment: Defer to PT evaluation       Communication Communication Communication: No difficulties   Cognition Arousal/Alertness: Awake/alert Behavior During Therapy: WFL for tasks assessed/performed Overall Cognitive Status: Impaired/Different from baseline Area of Impairment: Orientation;Memory;Following commands;Safety/judgement;Awareness;Problem solving                 Orientation Level: Disoriented to;Place;Time;Situation   Memory: Decreased short-term memory;Decreased recall of precautions Following Commands: Follows one step commands with increased time;Follows multi-step commands inconsistently Safety/Judgement: Decreased awareness of deficits Awareness: Intellectual Problem Solving: Requires verbal cues;Requires tactile cues;Slow processing     General Comments       Exercises     Shoulder Instructions      Home Living Family/patient expects to be discharged to:: Private residence Living Arrangements: Spouse/significant other Available Help at Discharge: Family Type of Home: House Home Access: Stairs to enter [employer] of Steps: 4 or 6 Entrance Stairs-Rails:  (unsure) Home Layout: Multi-level     Bathroom Shower/Tub: Producer, television/film/video: Standard Bathroom Accessibility: Yes How Accessible: Accessible via walker Home Equipment: Walker - 2 wheels;Bedside commode   Additional Comments: pt decrease orientation      Prior Functioning/Environment Level of Independence: Needs assistance        Comments: pt unable to report but says they need some helo        OT Problem List: Decreased strength;Decreased range of motion;Decreased activity tolerance;Impaired balance (sitting and/or standing);Decreased safety awareness;Pain      OT  Treatment/Interventions: Self-care/ADL training;Therapeutic exercise;DME and/or AE instruction;Therapeutic activities;Cognitive remediation/compensation;Patient/family education;Balance training    OT Goals(Current goals can be found in the care plan section) Acute Rehab OT Goals Patient Stated Goal: did not state OT Goal Formulation: With patient Time For Goal Achievement: 10/21/20 Potential to Achieve Goals: Good ADL Goals Pt Will Perform Upper Body Dressing: with supervision;sitting Pt Will Perform Lower Body Dressing: with min assist;sit to/from stand Pt Will Transfer to Toilet: with min assist;regular height toilet;grab bars  OT Frequency: Min 2X/week   Barriers to D/C: Decreased caregiver support          Co-evaluation PT/OT/SLP Co-Evaluation/Treatment: Yes Reason for Co-Treatment: Complexity of the patient's impairments (multi-system involvement)   OT goals addressed during session: ADL's and self-care      AM-PAC OT "6 Clicks" Daily Activity     Outcome Measure Help from another person eating meals?: None Help from another person taking care of personal grooming?: A Little Help from another person toileting, which includes using toliet, bedpan, or urinal?: A Little Help from another person bathing (including washing, rinsing, drying)?: A Lot Help from another person to put on and taking off regular upper body clothing?: A Little Help from another person to put on and taking off regular lower body clothing?: A Lot 6 Click Score: 17   End of Session Equipment Utilized During Treatment: Gait belt;Rolling walker Nurse Communication: Other (comment) (tech made aware about position and confusion)  Activity Tolerance: Patient tolerated treatment well Patient left: in chair;with call bell/phone within reach;with chair alarm set  OT Visit Diagnosis: Unsteadiness on feet (R26.81);Other abnormalities of gait and mobility (R26.89)                Time: 8119-1478 OT Time  Calculation (min): 34 min Charges:  OT General Charges $OT Visit: 1 Visit OT Evaluation $OT Eval Low Complexity: 1 Low  Alphia Moh OTR/L  Acute Rehab Services  [phone] office number [phone] pager number   Alphia Moh 10/01/2020, 1:21 PM

## 2020-10-02 LAB — TYPE AND SCREEN
ABO/RH(D): AB POS
Antibody Screen: NEGATIVE
Unit division: 0

## 2020-10-02 LAB — BASIC METABOLIC PANEL
Anion gap: 10 (ref 5–15)
BUN: 20 mg/dL (ref 8–23)
CO2: 23 mmol/L (ref 22–32)
Calcium: 8 mg/dL — ABNORMAL LOW (ref 8.9–10.3)
Chloride: 100 mmol/L (ref 98–111)
Creatinine, Ser: 0.72 mg/dL (ref 0.44–1.00)
GFR, Estimated: >60 mL/min (ref >60)
Glucose, Bld: 105 mg/dL — ABNORMAL HIGH (ref 70–99)
Potassium: 4 mmol/L (ref 3.5–5.1)
Sodium: 133 mmol/L — ABNORMAL LOW (ref 135–145)

## 2020-10-02 LAB — BPAM RBC
Blood Product Expiration Date: 202203042359
ISSUE DATE / TIME: 202201291644
Unit Type and Rh: 8400

## 2020-10-02 LAB — CBC
HCT: 24.4 % — ABNORMAL LOW (ref 36.0–46.0)
Hemoglobin: 8.3 g/dL — ABNORMAL LOW (ref 12.0–15.0)
MCH: 22.8 pg — ABNORMAL LOW (ref 26.0–34.0)
MCHC: 34 g/dL (ref 30.0–36.0)
MCV: 67 fL — ABNORMAL LOW (ref 80.0–100.0)
Platelets: 185 10*3/uL (ref 150–400)
RBC: 3.64 MIL/uL — ABNORMAL LOW (ref 3.87–5.11)
RDW: 20.8 % — ABNORMAL HIGH (ref 11.5–15.5)
WBC: 13.2 10*3/uL — ABNORMAL HIGH (ref 4.0–10.5)
nRBC: 0.2 % (ref 0.0–0.2)

## 2020-10-02 NOTE — Plan of Care (Signed)

## 2020-10-02 NOTE — Progress Notes (Signed)
Patient ID: Dawn Oneill, female   DOB: 10-04-1925, 87 y.o.   MRN: [id number] Subjective: 2 Days Post-Op Procedure(s) (LRB): INTRAMEDULLARY (IM) NAIL INTERTROCHANTRIC (Left)    Patient reports pain as mild to moderate, on and off.  Remains a little confused with regards to timing of admission. Daughter lives with her. Comfortable this am up eating breakfast  Objective:   VITALS:   Vitals:   10/01/20 1948 10/02/20 0428  BP: (!) 116/53 (!) 116/56  Pulse: 85 87  Resp: 16 16  Temp: 97.7 F (36.5 C) 98.2 F (36.8 C)  SpO2: 93% (!) 88%    Neurovascular intact Incision: dressing C/D/I left hip  LABS Recent Labs    09/30/20 1434 10/01/20 0159 10/02/20 0230  HGB 7.8* 7.2* 8.3*  HCT 24.0* 22.3* 24.4*  WBC 13.5* 12.1* 13.2*  PLT 214 187 185    Recent Labs    09/30/20 1434 10/01/20 0159 10/02/20 0230  NA 136 135 133*  K 4.2 3.7 4.0  BUN 10 13 20   CREATININE 0.83 0.77 0.72  GLUCOSE 127* 106* 105*    Recent Labs    09/29/20 1852  INR 1.3*     Assessment/Plan: 2 Days Post-Op Procedure(s) (LRB): INTRAMEDULLARY (IM) NAIL INTERTROCHANTRIC (Left)   Up with therapy  Disposition pending further assessment of activity level and needs RTC in 2 weeks with [place]

## 2020-10-02 NOTE — Progress Notes (Signed)
PROGRESS NOTE    Dawn Oneill  EXB:[id number] DOB: Nov 21, 1925 DOA: 09/29/2020 PCP: Patient, No Pcp Per   Chief Complaint  Patient presents with  . Hip Injury  . Fall  Brief Narrative: 87 year old female with a chronic microcytic anemia chronic back and knee pain carotid artery stenosis and AAA, hypothyroidism, hypertension, ongoing tobacco abuse presented to the ED with severe left hip pain and deformity after a fall at home when dog bumped into her and caused her to fall, without loss of consciousness. In the ED afebrile saturating well on room air vitals stable EKG sinus rhythm CT head negative for acute finding chest x-ray possible new pulmonary nodule with outpatient CT recommended, x-ray showed left hip fracture lab with significant leukocytosis anemia orthopedic was consulted and admitted for further management. 1/28- s/p intramedullary nail intertrochanteric on left. Had acute blood/anemia 1 unit.  1 unit prbc transfused 1/29  Subjective: Afebrile overnight. H/h improved appropriately.  She does not remember how long she has been here. Spoke w/ daughter on the phone On 2l [place]   Assessment & Plan:  Closed fracture of left hip: s/p intertrochanteric intramedullary nailing 1/28 by orthopedics.  On Lovenox for DVT prophylaxis.  Continue PT OT and further plan likely skilled nursing facility, consult TOC. Defer pain management Rx and outpatient DVT prophylaxis to orthopedics   Microcytic anemia,chronic/history of thalassemia minor as per patient, with acute blood loss anemia due to #1 hemoglobin had significantly dropped to 7.2 g.Received 1 unit PRBC, appropriately improved hemoglobin. Monitor.Baseline 11.9 gm in may 2021. Recent Labs  Lab 09/29/20 1852 09/30/20 1434 10/01/20 0159 10/02/20 0230  HGB 10.5* 7.8* 7.2* 8.3*  HCT 33.6* 24.0* 22.3* 24.4*   Leukocytosis: Unclear etiology, likely reactive from #1 with leukocytosis significantly improved postop.although remains  slightly up but afebrile, no pneumonia on chest x-ray. ordered UA but not available but of note she already received preop Ancef. Recent Labs  Lab 09/29/20 1852 09/30/20 1434 10/01/20 0159 10/02/20 0230  WBC 23.9* 13.5* 12.1* 13.2*   Acute hypoxic resp failure, spo2 88% RA- needing 2l [place]  Acute metabolic encephalopathy- pt is confused this am having difficulty with recollection of events- does not remember falling and having fracture or surgery. Cont pt.ot supportive care. Moving all her extremities well. Watch labs, temp curve.She is on baclofen, xanax ,monitor.  Elevated TSH 9.5 free T4 1.1.  Continue Synthroid -adjust dose of Synthroid if TSH remains elevated in next 2 to 3 weeks  Lung nodule: Will need outpatient CT follow-up, if she desires to do so-will need to see her with PCP Daughter aware.  Carotid artery stenosis/AAA asymptomatic followed by vascular surgery as outpatient.  Continue high-dose statin.    Essential hypertension: BP is well controlled , at times soft so decreased amlodipine to 5 mg and hold if blood pressure less than 110 systolic.  Monitor.  Anxiety/depression: Continue home Zoloft and low-dose Xanax.  Nutrition: Diet Order            Diet Heart Room service appropriate? Yes; Fluid consistency: Thin  Diet effective now                Nutrition Problem: Increased nutrient needs Etiology: post-op healing Signs/Symptoms: estimated needs Interventions: Ensure Enlive (each supplement provides 350kcal and 20 grams of protein),MVI  Body mass index is 20.12 kg/m.  DVT prophylaxis: enoxaparin (LOVENOX) injection 40 mg Start: 10/01/20 0800 SCDs Start: 09/30/20 1309 Code Status:   Code Status: Full Code  Family Communication: plan of  care discussed with patient,  I spoke w/ [name], [phone] and updated here.  Status is: Inpatient Remains inpatient appropriate because:Inpatient level of care appropriate due to severity of illness and For hip fracture  management  Dispo: The patient is from: Home lives with daughter              Anticipated d/c is to: SNF              Anticipated d/c date is: when bed available.  TOC consulted.              Patient currently is not medically stable for discharge   Difficult to place patient No Consultants:see note  Procedures:see note  Culture/Microbiology No results found for: SDES, SPECREQUEST, CULT, REPTSTATUS  Other culture-see note  Medications: Scheduled Meds: . amLODipine  5 mg Oral Daily  . aspirin EC  81 mg Oral Daily  . atorvastatin  10 mg Oral Daily  . baclofen  5 mg Oral Daily  . docusate sodium  100 mg Oral BID  . enoxaparin (LOVENOX) injection  40 mg Subcutaneous Q24H  . feeding supplement  237 mL Oral BID BM  . gabapentin  100 mg Oral BID  . levothyroxine  75 mcg Oral Q0600  . senna  1 tablet Oral BID  . sertraline  100 mg Oral Daily   Continuous Infusions: . methocarbamol (ROBAXIN) IV      Antimicrobials: Anti-infectives (From admission, onward)   Start     Dose/Rate Route Frequency Ordered Stop   09/30/20 1315  ceFAZolin (ANCEF) IVPB 2g/100 mL premix        2 g 200 mL/hr over 30 Minutes Intravenous Every 6 hours 09/30/20 1308 09/30/20 2000   09/30/20 0918  ceFAZolin (ANCEF) 2-4 GM/100ML-% IVPB       Note to Pharmacy: [facility]   : cabinet override      09/30/20 0918 09/30/20 1037   09/30/20 0915  ceFAZolin (ANCEF) IVPB 2g/100 mL premix        2 g 200 mL/hr over 30 Minutes Intravenous On call to O.R. 09/30/20 0911 09/30/20 1100     Objective: Vitals: Today's Vitals   10/01/20 2228 10/01/20 2329 10/02/20 0428 10/02/20 0737  BP:   (!) 116/56 (!) 129/55  Pulse:   87 84  Resp:   16 16  Temp:   98.2 F (36.8 C) 98.5 F (36.9 C)  TempSrc:   Oral Oral  SpO2:   (!) 88% 90%  Weight:      Height:      PainSc: 10-Worst pain ever Asleep      Intake/Output Summary (Last 24 hours) at 10/02/2020 0807 Last data filed at 10/01/2020 1300 Gross per 24 hour  Intake  360 ml  Output --  Net 360 ml   Filed Weights   09/29/20 1840  Weight: 49.9 kg   Weight change:   Intake/Output from previous day: 01/29 0701 - 01/30 0700 In: 360 [P.O.:360] Out: -  Intake/Output this shift: No intake/output data recorded. Filed Weights   09/29/20 1840  Weight: 49.9 kg    Examination: General exam: AAOx2-3-appears forward full unable to recollect, elderly not in distress HEENT:Oral mucosa moist, Ear/Nose WNL grossly, dentition normal. Respiratory system: bilaterally clear,no wheezing or crackles,no use of accessory muscle Cardiovascular system: S1 & S2 +, No JVD,. Gastrointestinal system: Abdomen soft, NT,ND, BS+ Nervous System:Alert, awake, moving extremities  Extremities: No edema, distal peripheral pulses palpable,with left hip surgical site with intact dressing.  Skin:  No rashes,no icterus. MSK: Normal muscle bulk,tone, power  Data Reviewed: I have personally reviewed following labs and imaging studies CBC: Recent Labs  Lab 09/29/20 1852 09/30/20 1434 10/01/20 0159 10/02/20 0230  WBC 23.9* 13.5* 12.1* 13.2*  NEUTROABS 22.5*  --   --   --   HGB 10.5* 7.8* 7.2* 8.3*  HCT 33.6* 24.0* 22.3* 24.4*  MCV 62.7* 63.0* 63.0* 67.0*  PLT 306 214 187 185   Basic Metabolic Panel: Recent Labs  Lab 09/29/20 1852 09/30/20 1434 10/01/20 0159 10/02/20 0230  NA 136 136 135 133*  K 3.8 4.2 3.7 4.0  CL 101 103 102 100  CO2 23 25 22 23   GLUCOSE 144* 127* 106* 105*  BUN 10 10 13 20   CREATININE 0.76 0.83 0.77 0.72  CALCIUM 9.0 8.0* 8.0* 8.0*   GFR: Estimated Creatinine Clearance: 35.3 mL/min (by C-G formula based on SCr of 0.72 mg/dL). Liver Function Tests: Recent Labs  Lab 09/29/20 1852  AST 21  ALT 14  ALKPHOS 83  BILITOT 1.2  PROT 6.2*  ALBUMIN 3.5   No results for input(s): LIPASE, AMYLASE in the last 168 hours. No results for input(s): AMMONIA in the last 168 hours. Coagulation Profile: Recent Labs  Lab 09/29/20 1852  INR 1.3*    Cardiac Enzymes: No results for input(s): CKTOTAL, CKMB, CKMBINDEX, TROPONINI in the last 168 hours. BNP (last 3 results) No results for input(s): PROBNP in the last 8760 hours. HbA1C: No results for input(s): HGBA1C in the last 72 hours. CBG: No results for input(s): GLUCAP in the last 168 hours. Lipid Profile: No results for input(s): CHOL, HDL, LDLCALC, TRIG, CHOLHDL, LDLDIRECT in the last 72 hours. Thyroid Function Tests: Recent Labs    09/29/20 2024 09/30/20 1434  TSH 9.554*  --   FREET4  --  1.11   Anemia Panel: No results for input(s): VITAMINB12, FOLATE, FERRITIN, TIBC, IRON, RETICCTPCT in the last 72 hours. Sepsis Labs: No results for input(s): PROCALCITON, LATICACIDVEN in the last 168 hours.  Recent Results (from the past 240 hour(s))  SARS Coronavirus 2 by RT PCR (hospital order, performed in [facility] hospital lab) Nasopharyngeal Nasopharyngeal Swab     Status: None   Collection Time: 09/29/20  6:52 PM   Specimen: Nasopharyngeal Swab  Result Value Ref Range Status   SARS Coronavirus 2 NEGATIVE NEGATIVE Final    Comment: (NOTE) SARS-CoV-2 target nucleic acids are NOT DETECTED.  The SARS-CoV-2 RNA is generally detectable in upper and lower respiratory specimens during the acute phase of infection. The lowest concentration of SARS-CoV-2 viral copies this assay can detect is 250 copies / mL. A negative result does not preclude SARS-CoV-2 infection and should not be used as the sole basis for treatment or other patient management decisions.  A negative result may occur with improper specimen collection / handling, submission of specimen other than nasopharyngeal swab, presence of viral mutation(s) within the areas targeted by this assay, and inadequate number of viral copies (<250 copies / mL). A negative result must be combined with clinical observations, patient history, and epidemiological information.  Fact Sheet for Patients:    [facility]  Fact Sheet for Healthcare Providers: 10/01/20  This test is not yet approved or  cleared by the [employer] FDA and has been authorized for detection and/or diagnosis of SARS-CoV-2 by FDA under an Emergency Use Authorization (EUA).  This EUA will remain in effect (meaning this test can be used) for the duration of the COVID-19 declaration under Section  564(b)(1) of the Act, 21 U.S.C. section 360bbb-3(b)(1), unless the authorization is terminated or revoked sooner.  Performed at [facility], [address] [place], [place] [address]      Radiology Studies: Pelvis Portable  Result Date: 09/30/2020 CLINICAL DATA:  Left femur fracture ORIF. EXAM: PORTABLE PELVIS 1-2 VIEWS COMPARISON:  Pelvic radiographs 09/29/2020. FINDINGS: Two AP views of the pelvis demonstrate interval left femoral intramedullary nail and dynamic screw fixation of the comminuted intertrochanteric and subtrochanteric fracture. There is near anatomic reduction of the main fracture fragments. No complications are identified. Moderate underlying degenerative changes of both hips are noted. There are diffuse vascular calcifications status post aortobifemoral stent grafting. IMPRESSION: Near anatomic reduction of the comminuted intertrochanteric and subtrochanteric left femoral fracture post ORIF. Electronically Signed   By: Carey Bullocks M.D.   On: 09/30/2020 13:37   DG C-Arm 1-60 Min  Result Date: 09/30/2020 CLINICAL DATA:  Left femur fracture ORIF. EXAM: DG C-ARM 1-60 MIN; LEFT FEMUR 2 VIEWS COMPARISON:  Pelvic and left femur radiographs 09/29/2020 FINDINGS: C-arm fluoroscopy was provided in the operating room. 1 minutes and 17 seconds fluoroscopy time. Six spot fluoroscopic images are submitted. These demonstrate the placement of a left femoral intramedullary nail and dynamic femoral neck screw with near anatomic reduction the proximal  femur fracture. The hardware is well positioned. There is a distal interlocking screw. No complications are identified. IMPRESSION: Intraoperative views during ORIF of proximal left femur fracture. No demonstrated complications. Electronically Signed   By: Carey Bullocks M.D.   On: 09/30/2020 13:41   DG FEMUR MIN 2 VIEWS LEFT  Result Date: 09/30/2020 CLINICAL DATA:  Left femur fracture ORIF. EXAM: DG C-ARM 1-60 MIN; LEFT FEMUR 2 VIEWS COMPARISON:  Pelvic and left femur radiographs 09/29/2020 FINDINGS: C-arm fluoroscopy was provided in the operating room. 1 minutes and 17 seconds fluoroscopy time. Six spot fluoroscopic images are submitted. These demonstrate the placement of a left femoral intramedullary nail and dynamic femoral neck screw with near anatomic reduction the proximal femur fracture. The hardware is well positioned. There is a distal interlocking screw. No complications are identified. IMPRESSION: Intraoperative views during ORIF of proximal left femur fracture. No demonstrated complications. Electronically Signed   By: Carey Bullocks M.D.   On: 09/30/2020 13:41   DG FEMUR PORT MIN 2 VIEWS LEFT  Result Date: 09/30/2020 CLINICAL DATA:  Fracture of proximal left femur. EXAM: LEFT FEMUR PORTABLE 2 VIEWS COMPARISON:  09/29/2020 FINDINGS: Postoperative changes from open reduction and internal fixation of the comminuted intertrochanteric fracture involving the proximal right femur. There has been placement of hip screw and IM nail. Hardware components are in anatomic alignment. Mild lateral displacement of the greater trochanter noted. IMPRESSION: 1. Status post ORIF of proximal left femur fracture. Electronically Signed   By: Signa Kell M.D.   On: 09/30/2020 12:02     LOS: 3 days   Lanae Boast, MD [facility]  10/02/2020, 8:07 AM

## 2020-10-03 ENCOUNTER — Encounter (HOSPITAL_COMMUNITY): Payer: Self-pay | Admitting: Orthopedic Surgery

## 2020-10-03 LAB — BASIC METABOLIC PANEL
Anion gap: 10 (ref 5–15)
BUN: 18 mg/dL (ref 8–23)
CO2: 23 mmol/L (ref 22–32)
Calcium: 8.1 mg/dL — ABNORMAL LOW (ref 8.9–10.3)
Chloride: 104 mmol/L (ref 98–111)
Creatinine, Ser: 0.66 mg/dL (ref 0.44–1.00)
GFR, Estimated: >60 mL/min (ref >60)
Glucose, Bld: 101 mg/dL — ABNORMAL HIGH (ref 70–99)
Potassium: 3.7 mmol/L (ref 3.5–5.1)
Sodium: 137 mmol/L (ref 135–145)

## 2020-10-03 LAB — CBC
HCT: 24.6 % — ABNORMAL LOW (ref 36.0–46.0)
Hemoglobin: 8.3 g/dL — ABNORMAL LOW (ref 12.0–15.0)
MCH: 22.6 pg — ABNORMAL LOW (ref 26.0–34.0)
MCHC: 33.7 g/dL (ref 30.0–36.0)
MCV: 67 fL — ABNORMAL LOW (ref 80.0–100.0)
Platelets: 243 10*3/uL (ref 150–400)
RBC: 3.67 MIL/uL — ABNORMAL LOW (ref 3.87–5.11)
RDW: 21 % — ABNORMAL HIGH (ref 11.5–15.5)
WBC: 12 10*3/uL — ABNORMAL HIGH (ref 4.0–10.5)
nRBC: 0 % (ref 0.0–0.2)

## 2020-10-03 LAB — SARS CORONAVIRUS 2 (TAT 6-24 HRS): SARS Coronavirus 2: NEGATIVE

## 2020-10-03 MED ORDER — HYDROCODONE-ACETAMINOPHEN 5-325 MG PO TABS: 1.0000 | ORAL_TABLET | ORAL | 0 refills | Status: AC | PRN

## 2020-10-03 MED ORDER — FERROUS SULFATE 325 (65 FE) MG PO TABS
325.0000 mg | ORAL_TABLET | Freq: Every day | ORAL | Status: DC
  Administered 2020-10-04 – 2020-10-05 (×2): 325 mg via ORAL
  Filled 2020-10-03 (×2): qty 1

## 2020-10-03 MED ORDER — DOCUSATE SODIUM 100 MG PO CAPS: 100.0000 mg | ORAL_CAPSULE | Freq: Two times a day (BID) | ORAL | 0 refills | Status: AC

## 2020-10-03 MED ORDER — AMLODIPINE BESYLATE 5 MG PO TABS: 5.0000 mg | ORAL_TABLET | Freq: Every day | ORAL | 0 refills | Status: AC

## 2020-10-03 MED ORDER — ALPRAZOLAM 0.5 MG PO TABS: 0.2500 mg | ORAL_TABLET | Freq: Three times a day (TID) | ORAL | 0 refills | Status: AC | PRN

## 2020-10-03 MED ORDER — ASPIRIN 81 MG PO TBEC: 81.0000 mg | DELAYED_RELEASE_TABLET | Freq: Two times a day (BID) | ORAL | 0 refills | Status: AC

## 2020-10-03 NOTE — Plan of Care (Signed)
  Problem: Education: Goal: Knowledge of General Education information will improve Description: Including pain rating scale, medication(s)/side effects and non-pharmacologic comfort measures Outcome: Not Progressing   Problem: Health Behavior/Discharge Planning: Goal: Ability to manage health-related needs will improve Outcome: Not Progressing   Problem: Clinical Measurements: Goal: Ability to maintain clinical measurements within normal limits will improve Outcome: Not Progressing  Patient has memory impairment 

## 2020-10-03 NOTE — Plan of Care (Signed)

## 2020-10-03 NOTE — NC FL2 (Signed)
[place] MEDICAID FL2 LEVEL OF CARE SCREENING TOOL     IDENTIFICATION  Patient Name: Dawn Oneill Birthdate: [birth year] Sex: female Admission Date (Current Location): 09/29/2020  [facility] [place] Number:  Producer, television/film/video and Address:  [facility] [place], [address] [place], [place] [address]      Provider Number: [id number]  Attending Physician Name and Address:  Lanae Boast, MD  Relative Name and Phone Number:       Current Level of Care: Hospital Recommended Level of Care: Skilled Nursing Facility Prior Approval Number:    Date Approved/Denied:   PASRR Number: [id number]  Discharge Plan: SNF    Current Diagnoses: Patient Active Problem List   Diagnosis Date Noted  . Closed fracture of left hip (HCC) 09/29/2020  . Microcytic anemia 09/29/2020  . Leukocytosis 09/29/2020  . Lung nodule 09/29/2020  . Hypothyroidism 09/29/2020  . AAA (abdominal aortic aneurysm) (HCC) 09/29/2020  . Carotid artery stenosis 09/29/2020    Orientation RESPIRATION BLADDER Height & Weight     Self,Situation,Place  Normal Continent Weight: 110 lb (49.9 kg) Height:  5\' 2"  (157.5 cm)  BEHAVIORAL SYMPTOMS/MOOD NEUROLOGICAL BOWEL NUTRITION STATUS      Continent    AMBULATORY STATUS COMMUNICATION OF NEEDS Skin   Limited Assist Verbally Surgical wounds                       Personal Care Assistance Level of Assistance  Bathing,Dressing Bathing Assistance: Limited assistance   Dressing Assistance: Limited assistance     Functional Limitations Info  Sight,Hearing,Speech Sight Info: Impaired Hearing Info: Impaired Speech Info: Adequate    SPECIAL CARE FACTORS FREQUENCY  PT (By licensed PT),OT (By licensed OT)                    Contractures Contractures Info: Not present    Additional Factors Info  Code Status Code Status Info: FULL CODE             Current Medications (10/03/2020):  This is the current hospital active  medication list Current Facility-Administered Medications  Medication Dose Route Frequency Provider Last Rate Last Admin  . acetaminophen (TYLENOL) tablet 325-650 mg  325-650 mg Oral Q6H PRN 10/05/2020, MD   650 mg at 10/01/20 2229  . ALPRAZolam 2230) tablet 0.25-0.5 mg  0.25-0.5 mg Oral TID PRN Prudy Feeler, MD   0.25 mg at 10/01/20 2229  . amLODipine (NORVASC) tablet 5 mg  5 mg Oral Daily Kc, Ramesh, MD   5 mg at 10/03/20 1007  . aspirin EC tablet 81 mg  81 mg Oral Daily 10/05/20, MD   81 mg at 10/03/20 1007  . atorvastatin (LIPITOR) tablet 10 mg  10 mg Oral Daily 10/05/20, MD   10 mg at 10/03/20 1007  . baclofen (LIORESAL) tablet 5 mg  5 mg Oral TID PRN 10/05/20, MD   5 mg at 10/02/20 1031  . baclofen (LIORESAL) tablet 5 mg  5 mg Oral Daily Swinteck, 10/04/20, MD   5 mg at 10/03/20 1007  . docusate sodium (COLACE) capsule 100 mg  100 mg Oral BID 10/05/20, MD   100 mg at 10/03/20 1006  . enoxaparin (LOVENOX) injection 40 mg  40 mg Subcutaneous Q24H 10/05/20, MD   40 mg at 10/03/20 1008  . feeding supplement (ENSURE ENLIVE / ENSURE PLUS) liquid 237 mL  237 mL Oral BID BM 10/05/20, MD  237 mL at 10/03/20 1008  . [START ON 10/04/2020] ferrous sulfate tablet 325 mg  325 mg Oral Q breakfast Kc, Ramesh, MD      . gabapentin (NEURONTIN) capsule 100 mg  100 mg Oral BID Samson Frederic, MD   100 mg at 10/03/20 1007  . HYDROcodone-acetaminophen (NORCO) 7.5-325 MG per tablet 1-2 tablet  1-2 tablet Oral Q4H PRN Samson Frederic, MD   1 tablet at 10/02/20 1307  . HYDROcodone-acetaminophen (NORCO/VICODIN) 5-325 MG per tablet 1-2 tablet  1-2 tablet Oral Q4H PRN Samson Frederic, MD   1 tablet at 10/03/20 1112  . levothyroxine (SYNTHROID) tablet 75 mcg  75 mcg Oral Q0600 Samson Frederic, MD   75 mcg at 10/03/20 0610  . menthol-cetylpyridinium (CEPACOL) lozenge 3 mg  1 lozenge Oral PRN Swinteck, Arlys John, MD       Or  . phenol (CHLORASEPTIC) mouth spray 1 spray  1  spray Mouth/Throat PRN Swinteck, Arlys John, MD      . methocarbamol (ROBAXIN) tablet 500 mg  500 mg Oral Q6H PRN Samson Frederic, MD   500 mg at 10/01/20 4680   Or  . methocarbamol (ROBAXIN) 500 mg in dextrose 5 % 50 mL IVPB  500 mg Intravenous Q6H PRN Swinteck, Arlys John, MD      . metoCLOPramide (REGLAN) tablet 5-10 mg  5-10 mg Oral Q8H PRN Swinteck, Arlys John, MD       Or  . metoCLOPramide (REGLAN) injection 5-10 mg  5-10 mg Intravenous Q8H PRN Swinteck, Arlys John, MD      . morphine 2 MG/ML injection 0.5-1 mg  0.5-1 mg Intravenous Q2H PRN Swinteck, Arlys John, MD      . ondansetron (ZOFRAN) tablet 4 mg  4 mg Oral Q6H PRN Swinteck, Arlys John, MD       Or  . ondansetron (ZOFRAN) injection 4 mg  4 mg Intravenous Q6H PRN Swinteck, Arlys John, MD      . senna (SENOKOT) tablet 8.6 mg  1 tablet Oral BID Samson Frederic, MD   8.6 mg at 10/03/20 1007  . senna-docusate (Senokot-S) tablet 1 tablet  1 tablet Oral QHS PRN Swinteck, Arlys John, MD      . sertraline (ZOLOFT) tablet 100 mg  100 mg Oral Daily Samson Frederic, MD   100 mg at 10/03/20 1007     Discharge Medications: Please see discharge summary for a list of discharge medications.  Relevant Imaging Results:  Relevant Lab Results:   Additional Information SS# [id number]  Deatra Robinson, [place]

## 2020-10-03 NOTE — Progress Notes (Signed)
    Subjective:  Patient reports pain as mild to moderate.  Denies N/V/CP/SOB.   Objective:   VITALS:   Vitals:   10/02/20 1543 10/02/20 1952 10/03/20 0341 10/03/20 0759  BP: (!) 121/57 116/74 (!) 136/59 (!) 145/58  Pulse: 83 81 87 90  Resp: 16 17 16 16   Temp: 98.4 F (36.9 C) 98.5 F (36.9 C) 99.3 F (37.4 C) 98.7 F (37.1 C)  TempSrc: Oral Oral Oral Oral  SpO2: 90% 91% 90% 91%  Weight:      Height:        NAD ABD soft Neurovascular intact Sensation intact distally Intact pulses distally Dorsiflexion/Plantar flexion intact Incision: dressing C/D/I   Lab Results  Component Value Date   WBC 12.0 (H) 10/03/2020   HGB 8.3 (L) 10/03/2020   HCT 24.6 (L) 10/03/2020   MCV 67.0 (L) 10/03/2020   PLT 243 10/03/2020   BMET    Component Value Date/Time   NA 137 10/03/2020 0427   K 3.7 10/03/2020 0427   CL 104 10/03/2020 0427   CO2 23 10/03/2020 0427   GLUCOSE 101 (H) 10/03/2020 0427   BUN 18 10/03/2020 0427   CREATININE 0.66 10/03/2020 0427   CALCIUM 8.1 (L) 10/03/2020 0427   GFRNONAA >60 10/03/2020 0427     Assessment/Plan: 3 Days Post-Op   Principal Problem:   Closed fracture of left hip (HCC) Active Problems:   Microcytic anemia   Leukocytosis   Lung nodule   Hypothyroidism   AAA (abdominal aortic aneurysm) (HCC)   Carotid artery stenosis   WBAT with walker DVT ppx: Aspirin and Lovenox, SCDs, TEDS   ASA 81mg  BID at D/C ABLA: HgB 8.3 this AM, treat per hospitalist recommendations PO pain control PT/OT Dispo: D/C pending clearance of PT/OT     10/05/2020 10/03/2020, 8:18 AM [facility] is now 10/05/2020 [address], [place], [employer] [place] Phone: [phone] [email or web address] Facebook  [phone]

## 2020-10-03 NOTE — TOC Initial Note (Signed)
**Note Dawn Oneill** Transition of Care Dawn Oneill) - Initial/Assessment Note    Patient Details  Name: Dawn Oneill MRN: [id number] Date of Birth: February 08, 1926  Transition of Care Dawn Oneill) CM/SW Contact:    Dawn Oneill, [place] Phone Number: 10/03/2020, 6:24 PM  Clinical Narrative:  SW spoke with pt's dtr Dawn Oneill re PT recommendation for SNF. Pt's dtr lives with her but works. Pt with no previous SNF stay. Explained SNF placement process and answered questions. Dtr reports agreeable to SNF and prefers Dawn Oneill. Will f/u with offers once available.   Dawn Oneill, MSW, LCSW [phone] (coverage)                    Expected Discharge Plan: Skilled Nursing Facility Barriers to Discharge: Continued Medical Work up   Patient Goals and CMS Choice   CMS Medicare.gov Compare Post Acute Care list provided to:: Patient Choice offered to / list presented to : Adult Children  Expected Discharge Plan and Services Expected Discharge Plan: Skilled Nursing Facility     Post Acute Care Choice: Skilled Nursing Facility Living arrangements for the past 2 months: Single Family Home                                      Prior Living Arrangements/Services Living arrangements for the past 2 months: Single Family Home            Need for Family Participation in Patient Care: Yes (Comment) Care giver support system in place?: Yes (comment)      Activities of Daily Living Home Assistive Devices/Equipment: Cane (specify quad or straight),Walker (specify type) ADL Screening (condition at time of admission) Patient's cognitive ability adequate to safely complete daily activities?: No Is the patient deaf or have difficulty hearing?: No Does the patient have difficulty seeing, even when wearing glasses/contacts?: No Does the patient have difficulty concentrating, remembering, or making decisions?: Yes Patient able to express need for assistance with ADLs?: Yes Does the patient have difficulty  dressing or bathing?: Yes Independently performs ADLs?: No Does the patient have difficulty walking or climbing stairs?: Yes Weakness of Legs: Left Weakness of Arms/Hands: None  Permission Sought/Granted Permission sought to share information with : Facility Industrial/product designer granted to share information with : Yes, Verbal Permission Granted              Emotional Assessment       Orientation: : Oriented to Self,Oriented to Situation,Oriented to Place      Admission diagnosis:  Fall [W19.XXXA] Pre-op chest exam [Z01.811] Closed fracture of left hip, initial encounter (HCC) [S72.002A] Fall, initial encounter [W19.XXXA] Closed left hip fracture, initial encounter [facility]) [S72.002A] Patient Active Problem List   Diagnosis Date Noted  . Closed fracture of left hip (HCC) 09/29/2020  . Microcytic anemia 09/29/2020  . Leukocytosis 09/29/2020  . Lung nodule 09/29/2020  . Hypothyroidism 09/29/2020  . AAA (abdominal aortic aneurysm) (HCC) 09/29/2020  . Carotid artery stenosis 09/29/2020   PCP:  Patient, No Pcp Per Pharmacy:   [id number] - [place], [place] - [address] OF [address] & Dawn Oneill [address] [place] [address] Phone: [phone] Fax: [phone]     Social Determinants of Health (SDOH) Interventions    Readmission Risk Interventions No flowsheet data found.

## 2020-10-03 NOTE — Discharge Summary (Signed)
Physician Discharge Summary  Dawn Oneill ZOX:[id number] DOB: 02/02/1926 DOA: 09/29/2020  PCP: Patient, No Pcp Per  Admit date: 09/29/2020 Discharge date: 10/05/2020  Admitted From: HOME Disposition:  snf  Recommendations for Outpatient Follow-up:  1. Follow up with PCP in 1-2 weeks, orthopedics in 2 weeks post surgery 2. Please obtain BMP/CBC in one week 3. Please follow up on the following pending results:  Home Health:no  Equipment/Devices: none  Discharge Condition: Stable Code Status:   Code Status: Full Code Diet recommendation:  Diet Order            Diet - low sodium heart healthy           Diet Heart Room service appropriate? Yes; Fluid consistency: Thin  Diet effective now                 Brief/Interim Summary:87 year old female with a chronic microcytic anemia chronic back and knee pain carotid artery stenosis and AAA, hypothyroidism, hypertension, ongoing tobacco abuse presented to the ED with severe left hip pain and deformity after a fall at home when dog bumped into her and caused her to fall, without loss of consciousness. In the ED afebrile saturating well on room air vitals stable EKG sinus rhythm CT head negative for acute finding chest x-ray possible new pulmonary nodule with outpatient CT recommended, x-ray showed left hip fracture lab with significant leukocytosis anemia orthopedic was consulted and admitted for further management. 1/28- s/p intramedullary nail intertrochanteric on left. Had acute blood/anemia 1 unit.  1 unit prbc transfused 1/29 Episode of mild confusion 1/30 but overall stable since. PT OT working with her and recommending skilled nursing facility, [facility] consulted. Patient is awaiting for placement. CT head DC with confusion and brought as per daughter patient has been getting some sundowning at home likely worsening of her baseline cognitive issues.  She will benefit with ongoing PT OT and skilled nursing facility placement  Discharge  Diagnoses:   Closed fracture of left hip: s/p intertrochanteric intramedullary nailing 1/28 by orthopedics.  Continue pain control, continue acetaminophen twice daily for DVT prophylaxis as per orthopedics.  Continue dressing change wound care and outpatient follow-up with orthopedics   Microcytic anemia,chronic/history of thalassemia minor as per patient, with acute blood loss anemia due to #1 hemoglobin had significantly dropped to 7.2 g. s/p 1 unit PRBC.  Hemoglobin improved since then at 9.1 g.  Check CBC at facility in 1 week, continue iron supplementation. Recent Labs  Lab 10/01/20 0159 10/02/20 0230 10/03/20 0427 10/04/20 1654 10/05/20 0312  HGB 7.2* 8.3* 8.3* 9.1* 9.1*  HCT 22.3* 24.4* 24.6* 27.0* 28.1*   Leukocytosis: Unclear etiology, likely reactive from #1 .it has been downtrending.  Again check CBC in 1 week, currently no evidence of infection.    Acute hypoxic resp failure/insufficiency, patient has been needing oxygen on and off, likely in the setting of postop/atelectasis.  No respiratory distress no chest pain.  Weaned off oxygen.    Acute metabolic encephalopathy with baseline mild cognitive impairment and recent sundowning at home- as per daughter she has been getting memory issues/sundowning recently but "nothing major" she is making sense after talking for sometime.Continue to reorient her vcont ptot, daughter would like to go her ot rehab sooner and okay w/ discharge to SNF today, she is mildly confused.  Elevated TSH 9.5 free improvement and recent sundowning at home 1.1.  Continue Synthroid -adjust dose of Synthroid if TSH remains elevated in next 2 to 3 weeks  Lung nodule:Will  need outpatient CT follow-up, if she desires to do so-will need to see her with PCP Daughter aware-I had discussed with her  Carotid artery stenosis/AAA asymptomatic followed by vascular surgery as outpatient.  Continue high-dose statin.    Essential hypertension: BP stable on half of home  dose  Amlodipine 5 mg.    Anxiety/depression: Continue home Zoloft and low-dose Xanax.  Deconditioning debility in the setting of fall multiple comorbidities anemia or legs.  Continue PT OT, she will be discharged to skilled nursing facility.  Consults:  Ortho  Subjective: Alert awake oriented to self her date of birth, pleasant, smiling, not agitated.  Follows commands appropriately.  Mildly confused  Discharge Exam: Vitals:   10/05/20 0525 10/05/20 0753  BP: (!) 147/64 126/76  Pulse: 76 96  Resp: 16 16  Temp: 98.4 F (36.9 C) 97.7 F (36.5 C)  SpO2: 90% 94%   General: Alert awake not in distress.  Cardiovascular: RRR, S1/S2 +, no rubs, no gallops Respiratory: Bilaterally clear, no wheezing, no rhonchi Abdominal: Soft, NT, ND, bowel sounds + Extremities: no edema, no cyanosis left hip surgical site with intact dressing.    Discharge Instructions  Discharge Instructions    Diet - low sodium heart healthy   Complete by: As directed    Discharge instructions   Complete by: As directed    Follow-up with orthopedics Dr. Linna Caprice office please call for appointment Check CBC  and BMP in 1 week at the skilled nursing facility  Please call call MD or return to ER for similar or worsening recurring problem that brought you to hospital or if any fever,nausea/vomiting,abdominal pain, uncontrolled pain, chest pain,  shortness of breath or any other alarming symptoms.  Please follow-up your doctor as instructed in a week time and call the office for appointment.  Please avoid alcohol, smoking, or any other illicit substance and maintain healthy habits including taking your regular medications as prescribed.  You were cared for by a hospitalist during your hospital stay. If you have any questions about your discharge medications or the care you received while you were in the hospital after you are discharged, you can call the unit and ask to speak with the hospitalist on call if the  hospitalist that took care of you is not available.  Once you are discharged, your primary care physician will handle any further medical issues. Please note that NO REFILLS for any discharge medications will be authorized once you are discharged, as it is imperative that you return to your primary care physician (or establish a relationship with a primary care physician if you do not have one) for your aftercare needs so that they can reassess your need for medications and monitor your lab values   Discharge wound care:   Complete by: As directed    Reinforce dressing and follow-up with orthopedics   Increase activity slowly   Complete by: As directed      Allergies as of 10/05/2020   No Known Allergies     Medication List    STOP taking these medications   candesartan 32 MG tablet Commonly known as: ATACAND     TAKE these medications   ALPRAZolam 0.5 MG tablet Commonly known as: XANAX Take 0.5-1 tablets (0.25-0.5 mg total) by mouth 3 (three) times daily as needed for up to 3 doses for sleep.   amLODipine 5 MG tablet Commonly known as: NORVASC Take 1 tablet (5 mg total) by mouth daily. What changed:   medication strength  how much to take   aspirin 81 MG EC tablet Take 1 tablet (81 mg total) by mouth 2 (two) times daily with a meal. Swallow whole. What changed:   when to take this  additional instructions   atorvastatin 10 MG tablet Commonly known as: LIPITOR Take 10 mg by mouth daily.   Baclofen 5 MG Tabs Take 5 mg by mouth daily.   docusate sodium 100 MG capsule Commonly known as: COLACE Take 1 capsule (100 mg total) by mouth 2 (two) times daily.   DRY EYES OP Apply 1 drop to eye daily as needed (dry eyes).   feeding supplement Liqd Take 237 mLs by mouth 2 (two) times daily between meals.   ferrous sulfate 325 (65 FE) MG tablet Take 1 tablet (325 mg total) by mouth daily with breakfast.   gabapentin 100 MG capsule Commonly known as: NEURONTIN Take 100  mg by mouth 2 (two) times daily.   HYDROcodone-acetaminophen 5-325 MG tablet Commonly known as: NORCO/VICODIN Take 1-2 tablets by mouth every 4 (four) hours as needed for up to 7 days for moderate pain (pain score 4-6).   levothyroxine 75 MCG tablet Commonly known as: SYNTHROID Take 75 mcg by mouth daily.   senna-docusate 8.6-50 MG tablet Commonly known as: Senokot-S Take 1 tablet by mouth at bedtime as needed for mild constipation.            Discharge Care Instructions  (From admission, onward)         Start     Ordered   10/05/20 0000  Discharge wound care:       Comments: Reinforce dressing and follow-up with orthopedics   10/05/20 1016          Contact information for follow-up providers    Samson FredericSwinteck, Brian, MD. Call.   Specialty: Orthopedic Surgery Why: FOR F/U 2 WK POST SURGERY Contact information: [address] [place] [id number] [phone]            Contact information for after-discharge care    Destination    [facility] .   Service: Skilled Nursing Contact information: [address] [place] [id number] [phone]                 No Known Allergies  The results of significant diagnostics from this hospitalization (including imaging, microbiology, ancillary and laboratory) are listed below for reference.    Microbiology: Recent Results (from the past 240 hour(s))  SARS Coronavirus 2 by RT PCR (hospital order, performed in [facility] hospital lab) Nasopharyngeal Nasopharyngeal Swab     Status: None   Collection Time: 09/29/20  6:52 PM   Specimen: Nasopharyngeal Swab  Result Value Ref Range Status   SARS Coronavirus 2 NEGATIVE NEGATIVE Final    Comment: (NOTE) SARS-CoV-2 target nucleic acids are NOT DETECTED.  The SARS-CoV-2 RNA is generally detectable in upper and lower respiratory specimens during the acute phase of infection. The lowest concentration of SARS-CoV-2 viral  copies this assay can detect is 250 copies / mL. A negative result does not preclude SARS-CoV-2 infection and should not be used as the sole basis for treatment or other patient management decisions.  A negative result may occur with improper specimen collection / handling, submission of specimen other than nasopharyngeal swab, presence of viral mutation(s) within the areas targeted by this assay, and inadequate number of viral copies (<250 copies / mL). A negative result must be combined with clinical observations, patient history,  and epidemiological information.  Fact Sheet for Patients:   BoilerBrush.com.cy  Fact Sheet for Healthcare Providers: https://pope.com/  This test is not yet approved or  cleared by the [place] FDA and has been authorized for detection and/or diagnosis of SARS-CoV-2 by FDA under an Emergency Use Authorization (EUA).  This EUA will remain in effect (meaning this test can be used) for the duration of the COVID-19 declaration under Section 564(b)(1) of the Act, 21 U.S.C. section 360bbb-3(b)(1), unless the authorization is terminated or revoked sooner.  Performed at [facility], [address] [place], [place] [address]   SARS CORONAVIRUS 2 (TAT 6-24 HRS) Nasopharyngeal Nasopharyngeal Swab     Status: None   Collection Time: 10/03/20  1:31 PM   Specimen: Nasopharyngeal Swab  Result Value Ref Range Status   SARS Coronavirus 2 NEGATIVE NEGATIVE Final    Comment: (NOTE) SARS-CoV-2 target nucleic acids are NOT DETECTED.  The SARS-CoV-2 RNA is generally detectable in upper and lower respiratory specimens during the acute phase of infection. Negative results do not preclude SARS-CoV-2 infection, do not rule out co-infections with other pathogens, and should not be used as the sole basis for treatment or other patient management decisions. Negative results must be combined with clinical  observations, patient history, and epidemiological information. The expected result is Negative.  Fact Sheet for Patients: HairSlick.no  Fact Sheet for Healthcare Providers: quierodirigir.com  This test is not yet approved or cleared by the [place] FDA and  has been authorized for detection and/or diagnosis of SARS-CoV-2 by FDA under an Emergency Use Authorization (EUA). This EUA will remain  in effect (meaning this test can be used) for the duration of the COVID-19 declaration under Se ction 564(b)(1) of the Act, 21 U.S.C. section 360bbb-3(b)(1), unless the authorization is terminated or revoked sooner.  Performed at [facility], [address] [place], [place] [address]     Procedures/Studies: DG Chest 1 View  Result Date: 09/29/2020 CLINICAL DATA:  Pain status post fall. EXAM: CHEST  1 VIEW COMPARISON:  None. FINDINGS: The heart size is unremarkable. There are aortic calcifications. There is suggestion of a pulmonary nodule in the left mid lung zone. There is scarring and atelectasis at the lung bases. There is no pneumothorax. No large pleural effusion. No acute osseous abnormality. IMPRESSION: 1. Possible pulmonary nodule in the left mid lung zone. A follow-up outpatient CT of the chest is recommended for further evaluation. 2. Bibasilar scarring and atelectasis. 3. No acute cardiopulmonary process. Electronically Signed   By: Katherine Mantle M.D.   On: 09/29/2020 19:45   DG Pelvis 1-2 Views  Result Date: 09/29/2020 CLINICAL DATA:  Pain EXAM: PELVIS - 1-2 VIEW; LEFT FEMUR 2 VIEWS COMPARISON:  None. FINDINGS: There is an acute displaced, comminuted intratrochanteric fracture of the proximal left femur. There is osteopenia. Advanced vascular calcifications are noted. There are degenerative changes of both hips. There is no acute osseous abnormality involving the distal femur. Mild degenerative changes are noted of  the knee. IMPRESSION: 1. Acute displaced, comminuted intratrochanteric fracture of the proximal left femur. 2. Osteopenia. 3. Advanced vascular calcifications. Electronically Signed   By: Katherine Mantle M.D.   On: 09/29/2020 19:41   CT Head Wo Contrast  Result Date: 09/29/2020 CLINICAL DATA:  Fall.  Head and neck trauma. EXAM: CT HEAD WITHOUT CONTRAST CT CERVICAL SPINE WITHOUT CONTRAST TECHNIQUE: Multidetector CT imaging of the head and cervical spine was performed following the standard protocol without intravenous contrast.  Multiplanar CT image reconstructions of the cervical spine were also generated. COMPARISON:  None. FINDINGS: CT HEAD FINDINGS Brain: Diffuse cerebral atrophy. Mild ventricular dilatation consistent with central atrophy. Low-attenuation changes in the deep white matter consistent with small vessel ischemia. No mass-effect or midline shift. No abnormal extra-axial fluid collections. Gray-white matter junctions are distinct. Basal cisterns are not effaced. No acute intracranial hemorrhage. Vascular: Mild intracranial arterial vascular calcifications. Skull: Calvarium appears intact. Degenerative changes in the temporomandibular joints. Sinuses/Orbits: Paranasal sinuses and mastoid air cells are clear. Other: None. CT CERVICAL SPINE FINDINGS Alignment: Normal alignment. Skull base and vertebrae: Diffuse bone demineralization. Skull base appears intact. No vertebral compression deformities. No focal bone lesion or bone destruction. Bone cortex appears intact. Soft tissues and spinal canal: No prevertebral soft tissue swelling. No abnormal paraspinal soft tissue mass or infiltration. Disc levels: Degenerative changes throughout the cervical spine with narrowed interspaces and endplate hypertrophic changes throughout, most prominent at C4-5 and C5-6 levels. Degenerative changes throughout the facet joints. Upper chest: Emphysematous changes in the lung apices. Vascular calcifications.  Calcified granulomas in the lung apices. Other: None. IMPRESSION: 1. No acute intracranial abnormalities. Chronic atrophy and small vessel ischemic changes. 2. Normal alignment of the cervical spine. Diffuse bone demineralization. Degenerative changes throughout the cervical spine. No acute displaced fractures identified. 3. Emphysematous changes and calcified granulomas in the lung apices. Emphysema (ICD10-J43.9). Electronically Signed   By: Burman Nieves M.D.   On: 09/29/2020 19:55   CT Cervical Spine Wo Contrast  Result Date: 09/29/2020 CLINICAL DATA:  Fall.  Head and neck trauma. EXAM: CT HEAD WITHOUT CONTRAST CT CERVICAL SPINE WITHOUT CONTRAST TECHNIQUE: Multidetector CT imaging of the head and cervical spine was performed following the standard protocol without intravenous contrast. Multiplanar CT image reconstructions of the cervical spine were also generated. COMPARISON:  None. FINDINGS: CT HEAD FINDINGS Brain: Diffuse cerebral atrophy. Mild ventricular dilatation consistent with central atrophy. Low-attenuation changes in the deep white matter consistent with small vessel ischemia. No mass-effect or midline shift. No abnormal extra-axial fluid collections. Gray-white matter junctions are distinct. Basal cisterns are not effaced. No acute intracranial hemorrhage. Vascular: Mild intracranial arterial vascular calcifications. Skull: Calvarium appears intact. Degenerative changes in the temporomandibular joints. Sinuses/Orbits: Paranasal sinuses and mastoid air cells are clear. Other: None. CT CERVICAL SPINE FINDINGS Alignment: Normal alignment. Skull base and vertebrae: Diffuse bone demineralization. Skull base appears intact. No vertebral compression deformities. No focal bone lesion or bone destruction. Bone cortex appears intact. Soft tissues and spinal canal: No prevertebral soft tissue swelling. No abnormal paraspinal soft tissue mass or infiltration. Disc levels: Degenerative changes throughout  the cervical spine with narrowed interspaces and endplate hypertrophic changes throughout, most prominent at C4-5 and C5-6 levels. Degenerative changes throughout the facet joints. Upper chest: Emphysematous changes in the lung apices. Vascular calcifications. Calcified granulomas in the lung apices. Other: None. IMPRESSION: 1. No acute intracranial abnormalities. Chronic atrophy and small vessel ischemic changes. 2. Normal alignment of the cervical spine. Diffuse bone demineralization. Degenerative changes throughout the cervical spine. No acute displaced fractures identified. 3. Emphysematous changes and calcified granulomas in the lung apices. Emphysema (ICD10-J43.9). Electronically Signed   By: Burman Nieves M.D.   On: 09/29/2020 19:55   Pelvis Portable  Result Date: 09/30/2020 CLINICAL DATA:  Left femur fracture ORIF. EXAM: PORTABLE PELVIS 1-2 VIEWS COMPARISON:  Pelvic radiographs 09/29/2020. FINDINGS: Two AP views of the pelvis demonstrate interval left femoral intramedullary nail and dynamic screw fixation of the comminuted intertrochanteric and subtrochanteric fracture.  There is near anatomic reduction of the main fracture fragments. No complications are identified. Moderate underlying degenerative changes of both hips are noted. There are diffuse vascular calcifications status post aortobifemoral stent grafting. IMPRESSION: Near anatomic reduction of the comminuted intertrochanteric and subtrochanteric left femoral fracture post ORIF. Electronically Signed   By: Carey Bullocks M.D.   On: 09/30/2020 13:37   DG C-Arm 1-60 Min  Result Date: 09/30/2020 CLINICAL DATA:  Left femur fracture ORIF. EXAM: DG C-ARM 1-60 MIN; LEFT FEMUR 2 VIEWS COMPARISON:  Pelvic and left femur radiographs 09/29/2020 FINDINGS: C-arm fluoroscopy was provided in the operating room. 1 minutes and 17 seconds fluoroscopy time. Six spot fluoroscopic images are submitted. These demonstrate the placement of a left femoral  intramedullary nail and dynamic femoral neck screw with near anatomic reduction the proximal femur fracture. The hardware is well positioned. There is a distal interlocking screw. No complications are identified. IMPRESSION: Intraoperative views during ORIF of proximal left femur fracture. No demonstrated complications. Electronically Signed   By: Carey Bullocks M.D.   On: 09/30/2020 13:41   DG FEMUR MIN 2 VIEWS LEFT  Result Date: 09/30/2020 CLINICAL DATA:  Left femur fracture ORIF. EXAM: DG C-ARM 1-60 MIN; LEFT FEMUR 2 VIEWS COMPARISON:  Pelvic and left femur radiographs 09/29/2020 FINDINGS: C-arm fluoroscopy was provided in the operating room. 1 minutes and 17 seconds fluoroscopy time. Six spot fluoroscopic images are submitted. These demonstrate the placement of a left femoral intramedullary nail and dynamic femoral neck screw with near anatomic reduction the proximal femur fracture. The hardware is well positioned. There is a distal interlocking screw. No complications are identified. IMPRESSION: Intraoperative views during ORIF of proximal left femur fracture. No demonstrated complications. Electronically Signed   By: Carey Bullocks M.D.   On: 09/30/2020 13:41   DG Femur Min 2 Views Left  Result Date: 09/29/2020 CLINICAL DATA:  Pain EXAM: PELVIS - 1-2 VIEW; LEFT FEMUR 2 VIEWS COMPARISON:  None. FINDINGS: There is an acute displaced, comminuted intratrochanteric fracture of the proximal left femur. There is osteopenia. Advanced vascular calcifications are noted. There are degenerative changes of both hips. There is no acute osseous abnormality involving the distal femur. Mild degenerative changes are noted of the knee. IMPRESSION: 1. Acute displaced, comminuted intratrochanteric fracture of the proximal left femur. 2. Osteopenia. 3. Advanced vascular calcifications. Electronically Signed   By: Katherine Mantle M.D.   On: 09/29/2020 19:41   DG FEMUR PORT MIN 2 VIEWS LEFT  Result Date:  09/30/2020 CLINICAL DATA:  Fracture of proximal left femur. EXAM: LEFT FEMUR PORTABLE 2 VIEWS COMPARISON:  09/29/2020 FINDINGS: Postoperative changes from open reduction and internal fixation of the comminuted intertrochanteric fracture involving the proximal right femur. There has been placement of hip screw and IM nail. Hardware components are in anatomic alignment. Mild lateral displacement of the greater trochanter noted. IMPRESSION: 1. Status post ORIF of proximal left femur fracture. Electronically Signed   By: Signa Kell M.D.   On: 09/30/2020 12:02    Labs: BNP (last 3 results) No results for input(s): BNP in the last 8760 hours. Basic Metabolic Panel: Recent Labs  Lab 10/01/20 0159 10/02/20 0230 10/03/20 0427 10/04/20 0322 10/05/20 0312  NA 135 133* 137 138 136  K 3.7 4.0 3.7 3.3* 3.8  CL 102 100 104 104 102  CO2 22 23 23 23 23   GLUCOSE 106* 105* 101* 108* 98  BUN 13 20 18 16 21   CREATININE 0.77 0.72 0.66 0.59 0.60  CALCIUM 8.0* 8.0*  8.1* 8.4* 8.8*   Liver Function Tests: Recent Labs  Lab 09/29/20 1852  AST 21  ALT 14  ALKPHOS 83  BILITOT 1.2  PROT 6.2*  ALBUMIN 3.5   No results for input(s): LIPASE, AMYLASE in the last 168 hours. No results for input(s): AMMONIA in the last 168 hours. CBC: Recent Labs  Lab 09/29/20 1852 09/30/20 1434 10/01/20 0159 10/02/20 0230 10/03/20 0427 10/04/20 1654 10/05/20 0312  WBC 23.9* 13.5* 12.1* 13.2* 12.0* 13.1*  --   NEUTROABS 22.5*  --   --   --   --   --   --   HGB 10.5* 7.8* 7.2* 8.3* 8.3* 9.1* 9.1*  HCT 33.6* 24.0* 22.3* 24.4* 24.6* 27.0* 28.1*  MCV 62.7* 63.0* 63.0* 67.0* 67.0* 66.7*  --   PLT 306 214 187 185 243 319  --    Cardiac Enzymes: No results for input(s): CKTOTAL, CKMB, CKMBINDEX, TROPONINI in the last 168 hours. BNP: Invalid input(s): POCBNP CBG: No results for input(s): GLUCAP in the last 168 hours. D-Dimer No results for input(s): DDIMER in the last 72 hours. Hgb A1c No results for input(s):  HGBA1C in the last 72 hours. Lipid Profile No results for input(s): CHOL, HDL, LDLCALC, TRIG, CHOLHDL, LDLDIRECT in the last 72 hours. Thyroid function studies No results for input(s): TSH, T4TOTAL, T3FREE, THYROIDAB in the last 72 hours.  Invalid input(s): FREET3 Anemia work up No results for input(s): VITAMINB12, FOLATE, FERRITIN, TIBC, IRON, RETICCTPCT in the last 72 hours. Urinalysis No results found for: COLORURINE, APPEARANCEUR, LABSPEC, PHURINE, GLUCOSEU, HGBUR, BILIRUBINUR, KETONESUR, PROTEINUR, UROBILINOGEN, NITRITE, LEUKOCYTESUR Sepsis Labs Invalid input(s): PROCALCITONIN,  WBC,  LACTICIDVEN Microbiology Recent Results (from the past 240 hour(s))  SARS Coronavirus 2 by RT PCR (hospital order, performed in [facility] hospital lab) Nasopharyngeal Nasopharyngeal Swab     Status: None   Collection Time: 09/29/20  6:52 PM   Specimen: Nasopharyngeal Swab  Result Value Ref Range Status   SARS Coronavirus 2 NEGATIVE NEGATIVE Final    Comment: (NOTE) SARS-CoV-2 target nucleic acids are NOT DETECTED.  The SARS-CoV-2 RNA is generally detectable in upper and lower respiratory specimens during the acute phase of infection. The lowest concentration of SARS-CoV-2 viral copies this assay can detect is 250 copies / mL. A negative result does not preclude SARS-CoV-2 infection and should not be used as the sole basis for treatment or other patient management decisions.  A negative result may occur with improper specimen collection / handling, submission of specimen other than nasopharyngeal swab, presence of viral mutation(s) within the areas targeted by this assay, and inadequate number of viral copies (<250 copies / mL). A negative result must be combined with clinical observations, patient history, and epidemiological information.  Fact Sheet for Patients:   BoilerBrush.com.cy  Fact Sheet for Healthcare  Providers: https://pope.com/  This test is not yet approved or  cleared by the [place] FDA and has been authorized for detection and/or diagnosis of SARS-CoV-2 by FDA under an Emergency Use Authorization (EUA).  This EUA will remain in effect (meaning this test can be used) for the duration of the COVID-19 declaration under Section 564(b)(1) of the Act, 21 U.S.C. section 360bbb-3(b)(1), unless the authorization is terminated or revoked sooner.  Performed at [facility], [address], [place], [place] [address]   SARS CORONAVIRUS 2 (TAT 6-24 HRS) Nasopharyngeal Nasopharyngeal Swab     Status: None   Collection Time: 10/03/20  1:31 PM   Specimen: Nasopharyngeal Swab  Result  Value Ref Range Status   SARS Coronavirus 2 NEGATIVE NEGATIVE Final    Comment: (NOTE) SARS-CoV-2 target nucleic acids are NOT DETECTED.  The SARS-CoV-2 RNA is generally detectable in upper and lower respiratory specimens during the acute phase of infection. Negative results do not preclude SARS-CoV-2 infection, do not rule out co-infections with other pathogens, and should not be used as the sole basis for treatment or other patient management decisions. Negative results must be combined with clinical observations, patient history, and epidemiological information. The expected result is Negative.  Fact Sheet for Patients: HairSlick.no  Fact Sheet for Healthcare Providers: quierodirigir.com  This test is not yet approved or cleared by the [place] FDA and  has been authorized for detection and/or diagnosis of SARS-CoV-2 by FDA under an Emergency Use Authorization (EUA). This EUA will remain  in effect (meaning this test can be used) for the duration of the COVID-19 declaration under Se ction 564(b)(1) of the Act, 21 U.S.C. section 360bbb-3(b)(1), unless the authorization is terminated or revoked  sooner.  Performed at [facility], [address], [place], [place] [address]      Time coordinating discharge: 35 minutes  SIGNED: Lanae Boast, MD  [facility] 10/05/2020, 11:34 AM  If 7PM-7AM, please contact night-coverage [email or web address]

## 2020-10-03 NOTE — Progress Notes (Signed)
Physical Therapy Treatment Patient Details Name: Dawn Oneill MRN: [id number] DOB: [birth year] Today's Date: 10/03/2020    History of Present Illness 87 y.o. female with a past medical history of chronic microcytic anemia, chronic back and knee pain, carotid artery stenosis, AAA, hypothyroidism, hypertension, and tobacco abuse who presented to the emergency department after a mechanical fall at home. Patient sustained comminuted L peritrochanteric femur fx. Patient s/p IM nail of L femur on 1/28    PT Comments    Pt requesting OOB to bathroom upon arrival to room for need to have BM, but pt already soiled. Pt is currently requiring mod assist for bed mobility, repeated transfers, and short-distance gait. Pt complaining of significant LLE pain, as well as dizziness with activity (BP and O2 requirements below). Pt tolerated low-level exercises well, with increased time and multimodal cuing. PT continuing to recommend SNF level of care post-acutely, will continue to follow.   - BP, HR after initial stand: 103/59, 89 bpm  - BP after short-distance gait: 115/50 - LEs lifted in recliner, and reclined   - Pt not on O2 upon arrival to room, SpO2 81% RA when assessed. 2LO2 applied to pt for recovery of sats 90-94%   Follow Up Recommendations  SNF;Supervision/Assistance - 24 hour     Equipment Recommendations  Other (comment) (defer to post acute rehab)    Recommendations for Other Services       Precautions / Restrictions Precautions Precautions: Fall Restrictions Weight Bearing Restrictions: No LLE Weight Bearing: Weight bearing as tolerated    Mobility  Bed Mobility Overal bed mobility: Needs Assistance Bed Mobility: Supine to Sit     Supine to sit: Mod assist     General bed mobility comments: Mod assist for LLE translation, trunk elevation via HHA, and scooting to EOB with use of bed pad.  Transfers Overall transfer level: Needs assistance Equipment used: Rolling  walker (2 wheeled) Transfers: Sit to/from [employer] Sit to Stand: Mod assist;From elevated surface Stand pivot transfers: Mod assist       General transfer comment: Mod assist for power up, rise, and steady, as well as pivotal steps to reach [facility]. Pt reporting dizziness when up, BP 103/58 and SpO2 on RA 81% (O2 not in reach of pt upon arrival to room, reapplied O2 for recovery of sats to 94% on 2LO2)  Ambulation/Gait Ambulation/Gait assistance: Min assist Gait Distance (Feet): 5 Feet Assistive device: Rolling walker (2 wheeled) Gait Pattern/deviations: Step-to pattern;Decreased stance time - left;Antalgic;Decreased step length - left;Trunk flexed Gait velocity: decr   General Gait Details: Min assist to steady, guide pt/RW. Verbal cuing for sequencing, placement in RW, room navigation.   Stairs             Wheelchair Mobility    Modified Rankin (Stroke Patients Only)       Balance Overall balance assessment: Needs assistance Sitting-balance support: Feet supported Sitting balance-Leahy Scale: Fair Sitting balance - Comments: able to sit EOB without PT assist Postural control: Posterior lean Standing balance support: Bilateral upper extremity supported;During functional activity Standing balance-Leahy Scale: Poor Standing balance comment: reliant on UE support                            Cognition Arousal/Alertness: Awake/alert Behavior During Therapy: WFL for tasks assessed/performed Overall Cognitive Status: Impaired/Different from baseline Area of Impairment: Orientation;Memory;Following commands;Safety/judgement;Awareness;Problem solving  Orientation Level: Disoriented to;Place;Time;Situation   Memory: Decreased short-term memory;Decreased recall of precautions Following Commands: Follows one step commands with increased time;Follows multi-step commands inconsistently Safety/Judgement: Decreased awareness of  deficits Awareness: Intellectual Problem Solving: Requires verbal cues;Requires tactile cues;Slow processing;Difficulty sequencing General Comments: PT reoriented pt to reason for hospitalization, pt unable to recall she had hip surgery when asked later in session. Pt asks "what is in that picture? I can't see too well, maybe the [place]yline?" as pt is looking at dynmap. At end of session, pt thought we had moved locations in the hospital, suspect due to pt's different orientation in room while sitting in chair vs bed.      Exercises General Exercises - Lower Extremity Ankle Circles/Pumps: AROM;Both;10 reps;Seated Quad Sets: AAROM;Left;10 reps;Seated (in recliner)    General Comments General comments (skin integrity, edema, etc.): Pt not on O2 upon arrival to room, SpO2 81% when assessed. 2LO2 applied to pt for recovery of sats 90-94%      Pertinent Vitals/Pain Pain Assessment: Faces Faces Pain Scale: Hurts even more Pain Location: L hip during mobility Pain Descriptors / Indicators: Grimacing;Guarding;Discomfort Pain Intervention(s): Limited activity within patient's tolerance;Monitored during session;Repositioned    Home Living                      Prior Function            PT Goals (current goals can now be found in the care plan section) Acute Rehab PT Goals Patient Stated Goal: stop dizziness and pain PT Goal Formulation: With patient Time For Goal Achievement: 10/15/20 Potential to Achieve Goals: Fair Progress towards PT goals: Progressing toward goals    Frequency    Min 3X/week      PT Plan Current plan remains appropriate    Co-evaluation              AM-PAC PT "6 Clicks" Mobility   Outcome Measure  Help needed turning from your back to your side while in a flat bed without using bedrails?: A Lot Help needed moving from lying on your back to sitting on the side of a flat bed without using bedrails?: A Lot Help needed moving to and from  a bed to a chair (including a wheelchair)?: A Lot Help needed standing up from a chair using your arms (e.g., wheelchair or bedside chair)?: A Lot Help needed to walk in hospital room?: A Lot Help needed climbing 3-5 steps with a railing? : A Lot 6 Click Score: 12    End of Session Equipment Utilized During Treatment: Gait belt;Oxygen Activity Tolerance: Patient tolerated treatment well;Patient limited by fatigue Patient left: in chair;with call bell/phone within reach;with chair alarm set Nurse Communication: Mobility status;Other (comment) (chair alarm batteries replaced, not flashing green and may need to be changed out, RN expresses understanding) PT Visit Diagnosis: Unsteadiness on feet (R26.81);Muscle weakness (generalized) (M62.81);History of falling (Z91.81);Other abnormalities of gait and mobility (R26.89)     Time: 9811-9147 PT Time Calculation (min) (ACUTE ONLY): 33 min  Charges:  $Gait Training: 8-22 mins $Therapeutic Activity: 8-22 mins                     Marye Round, PT Acute Rehabilitation Services Pager [phone]  Office [phone]  Tyrone Apple E Christain [place] 10/03/2020, 1:58 PM

## 2020-10-03 NOTE — Progress Notes (Addendum)
PROGRESS NOTE    Dawn Oneill  YSA:[id number] DOB: May 20, 1926 DOA: 09/29/2020 PCP: Patient, No Pcp Per   Chief Complaint  Patient presents with  . Hip Injury  . Fall  Brief Narrative: 87 year old female with a chronic microcytic anemia chronic back and knee pain carotid artery stenosis and AAA, hypothyroidism, hypertension, ongoing tobacco abuse presented to the ED with severe left hip pain and deformity after a fall at home when dog bumped into her and caused her to fall, without loss of consciousness. In the ED afebrile saturating well on room air vitals stable EKG sinus rhythm CT head negative for acute finding chest x-ray possible new pulmonary nodule with outpatient CT recommended, x-ray showed left hip fracture lab with significant leukocytosis anemia orthopedic was consulted and admitted for further management. 1/28- s/p intramedullary nail intertrochanteric on left. Had acute blood/anemia 1 unit.  1 unit prbc transfused 1/29 Episode of mild confusion 1/30 but overall stable since. PT OT working with her and recommending skilled nursing facility, [facility] consulted.  Subjective: Patient is alert awake comfortable pain is controlled.  She has no new complaints.  Does not feel very well today but she is not confused like yesterday  Off o2    Assessment & Plan:  Closed fracture of left hip: s/p intertrochanteric intramedullary nailing 1/28 by orthopedics.  On Lovenox for DVT prophylaxis while here and orthopedic planning for aspirin 81 mg twice daily upon discharge.  Continue PT OT and skilled nursing facility placement.   Microcytic anemia,chronic/history of thalassemia minor as per patient, with acute blood loss anemia due to #1 hemoglobin had significantly dropped to 7.2 g. s/p 1 unit PRBC, hemoglobin stable at 8.3 g since.  Add iron supplement , check CBC in 7 days at [facility]. Baseline 11.9 gm in may 2021. Recent Labs  Lab 09/29/20 1852 09/30/20 1434 10/01/20 0159  10/02/20 0230 10/03/20 0427  HGB 10.5* 7.8* 7.2* 8.3* 8.3*  HCT 33.6* 24.0* 22.3* 24.4* 24.6*   Leukocytosis: Unclear etiology, likely reactive from #1 .it has significantly improved.  no pneumonia on chest x-ray. s/p preop Ancef. Recent Labs  Lab 09/29/20 1852 09/30/20 1434 10/01/20 0159 10/02/20 0230 10/03/20 0427  WBC 23.9* 13.5* 12.1* 13.2* 12.0*   Acute hypoxic resp failure/insufficiency, spo2 88% RA- needing 2l [place] on 1/30, this morning on room air  Acute metabolic encephalopathy-pt had episode of confusion on 1/30 was confused  And was having difficulty with recollection of events- did not remember falling and having fracture or surgery.  This morning she is alert awake oriented not in acute distress.  At baseline continue supportive care PT OT.   Elevated TSH 9.5 free T4 1.1.  Continue Synthroid -adjust dose of Synthroid if TSH remains elevated in next 2 to 3 weeks  Lung nodule: Will need outpatient CT follow-up, if she desires to do so-will need to see her with PCP Daughter aware-I had discussed with her  Carotid artery stenosis/AAA asymptomatic followed by vascular surgery as outpatient.  Continue high-dose statin.    Essential hypertension: BP is well controlled after cutting down amlodipine to 5 mg from 10 mg home dose   Anxiety/depression: Mood is stable this morning, continue Zoloft and low-dose Xanax.  Deconditioning debility in the setting of fall multiple comorbidities anemia or legs.  Continue PT OT, awaiting skilled placement.   Nutrition: Diet Order            Diet Heart Room service appropriate? Yes; Fluid consistency: Thin  Diet effective now  Nutrition Problem: Increased nutrient needs Etiology: post-op healing Signs/Symptoms: estimated needs Interventions: Ensure Enlive (each supplement provides 350kcal and 20 grams of protein),MVI  Body mass index is 20.12 kg/m.  DVT prophylaxis: enoxaparin (LOVENOX) injection 40 mg Start:  10/01/20 0800 SCDs Start: 09/30/20 1309 Code Status:   Code Status: Full Code  Family Communication: plan of care discussed with patient,  I had spokenm w/ [name], [phone] yesterday and updated here and SNF plan.  Status is: Inpatient Remains inpatient appropriate because:Inpatient level of care appropriate due to severity of illness and For hip fracture management  Dispo: The patient is from: Home lives with daughter              Anticipated d/c is to: SNF              Anticipated d/c date is: when bed available.  TOC consulted.              Patient currently is medically stable for discharge.  Will order covid sawb   Difficult to place patient No Consultants:see note  Procedures:see note  Culture/Microbiology No results found for: SDES, SPECREQUEST, CULT, REPTSTATUS  Other culture-see note  Medications: Scheduled Meds: . amLODipine  5 mg Oral Daily  . aspirin EC  81 mg Oral Daily  . atorvastatin  10 mg Oral Daily  . baclofen  5 mg Oral Daily  . docusate sodium  100 mg Oral BID  . enoxaparin (LOVENOX) injection  40 mg Subcutaneous Q24H  . feeding supplement  237 mL Oral BID BM  . gabapentin  100 mg Oral BID  . levothyroxine  75 mcg Oral Q0600  . senna  1 tablet Oral BID  . sertraline  100 mg Oral Daily   Continuous Infusions: . methocarbamol (ROBAXIN) IV      Antimicrobials: Anti-infectives (From admission, onward)   Start     Dose/Rate Route Frequency Ordered Stop   09/30/20 1315  ceFAZolin (ANCEF) IVPB 2g/100 mL premix        2 g 200 mL/hr over 30 Minutes Intravenous Every 6 hours 09/30/20 1308 09/30/20 2000   09/30/20 0918  ceFAZolin (ANCEF) 2-4 GM/100ML-% IVPB       Note to Pharmacy: Payton Emerald   : cabinet override      09/30/20 0918 09/30/20 1037   09/30/20 0915  ceFAZolin (ANCEF) IVPB 2g/100 mL premix        2 g 200 mL/hr over 30 Minutes Intravenous On call to O.R. 09/30/20 0911 09/30/20 1100     Objective: Vitals: Today's Vitals   10/02/20 1952  10/02/20 2200 10/03/20 0341 10/03/20 0759  BP: 116/74  (!) 136/59 (!) 145/58  Pulse: 81  87 90  Resp: 17  16 16   Temp: 98.5 F (36.9 C)  99.3 F (37.4 C) 98.7 F (37.1 C)  TempSrc: Oral  Oral Oral  SpO2: 91%  90% 91%  Weight:      Height:      PainSc:  Asleep     No intake or output data in the 24 hours ending 10/03/20 1021 Filed Weights   09/29/20 1840  Weight: 49.9 kg   Weight change:   Intake/Output from previous day: 01/30 0701 - 01/31 0700 In: 120 [P.O.:120] Out: -  Intake/Output this shift: No intake/output data recorded. Filed Weights   09/29/20 1840  Weight: 49.9 kg    Examination: General exam:  Elderly but alert awake communicative interactive.  On room air. HEENT:Oral mucosa moist, Ear/Nose WNL grossly, dentition  normal. Respiratory system: bilaterally diminished,no wheezing or crackles,no use of accessory muscle Cardiovascular system: S1 & S2 +, No JVD,. Gastrointestinal system: Abdomen soft, NT,ND, BS+ Nervous System:moving extremities and grossly nonfocal Extremities: Left hip surgical site with intact dressings- aquacel x3 no edema, distal peripheral pulses palpable.  Skin: No rashes,no icterus. MSK: Normal muscle bulk,tone, power  Data Reviewed: I have personally reviewed following labs and imaging studies CBC: Recent Labs  Lab 09/29/20 1852 09/30/20 1434 10/01/20 0159 10/02/20 0230 10/03/20 0427  WBC 23.9* 13.5* 12.1* 13.2* 12.0*  NEUTROABS 22.5*  --   --   --   --   HGB 10.5* 7.8* 7.2* 8.3* 8.3*  HCT 33.6* 24.0* 22.3* 24.4* 24.6*  MCV 62.7* 63.0* 63.0* 67.0* 67.0*  PLT 306 214 187 185 243   Basic Metabolic Panel: Recent Labs  Lab 09/29/20 1852 09/30/20 1434 10/01/20 0159 10/02/20 0230 10/03/20 0427  NA 136 136 135 133* 137  K 3.8 4.2 3.7 4.0 3.7  CL 101 103 102 100 104  CO2 23 25 22 23 23   GLUCOSE 144* 127* 106* 105* 101*  BUN 10 10 13 20 18   CREATININE 0.76 0.83 0.77 0.72 0.66  CALCIUM 9.0 8.0* 8.0* 8.0* 8.1*    GFR: Estimated Creatinine Clearance: 35.3 mL/min (by C-G formula based on SCr of 0.66 mg/dL). Liver Function Tests: Recent Labs  Lab 09/29/20 1852  AST 21  ALT 14  ALKPHOS 83  BILITOT 1.2  PROT 6.2*  ALBUMIN 3.5   No results for input(s): LIPASE, AMYLASE in the last 168 hours. No results for input(s): AMMONIA in the last 168 hours. Coagulation Profile: Recent Labs  Lab 09/29/20 1852  INR 1.3*   Cardiac Enzymes: No results for input(s): CKTOTAL, CKMB, CKMBINDEX, TROPONINI in the last 168 hours. BNP (last 3 results) No results for input(s): PROBNP in the last 8760 hours. HbA1C: No results for input(s): HGBA1C in the last 72 hours. CBG: No results for input(s): GLUCAP in the last 168 hours. Lipid Profile: No results for input(s): CHOL, HDL, LDLCALC, TRIG, CHOLHDL, LDLDIRECT in the last 72 hours. Thyroid Function Tests: Recent Labs    09/30/20 1434  FREET4 1.11   Anemia Panel: No results for input(s): VITAMINB12, FOLATE, FERRITIN, TIBC, IRON, RETICCTPCT in the last 72 hours. Sepsis Labs: No results for input(s): PROCALCITON, LATICACIDVEN in the last 168 hours.  Recent Results (from the past 240 hour(s))  SARS Coronavirus 2 by RT PCR (hospital order, performed in [facility] hospital lab) Nasopharyngeal Nasopharyngeal Swab     Status: None   Collection Time: 09/29/20  6:52 PM   Specimen: Nasopharyngeal Swab  Result Value Ref Range Status   SARS Coronavirus 2 NEGATIVE NEGATIVE Final    Comment: (NOTE) SARS-CoV-2 target nucleic acids are NOT DETECTED.  The SARS-CoV-2 RNA is generally detectable in upper and lower respiratory specimens during the acute phase of infection. The lowest concentration of SARS-CoV-2 viral copies this assay can detect is 250 copies / mL. A negative result does not preclude SARS-CoV-2 infection and should not be used as the sole basis for treatment or other patient management decisions.  A negative result may occur with improper specimen  collection / handling, submission of specimen other than nasopharyngeal swab, presence of viral mutation(s) within the areas targeted by this assay, and inadequate number of viral copies (<250 copies / mL). A negative result must be combined with clinical observations, patient history, and epidemiological information.  Fact Sheet for Patients:   [facility]  Fact Sheet  for Healthcare Providers: https://pope.com/  This test is not yet approved or  cleared by the [place] and has been authorized for detection and/or diagnosis of SARS-CoV-2 by FDA under an Emergency Use Authorization (EUA).  This EUA will remain in effect (meaning this test can be used) for the duration of the COVID-19 declaration under Section 564(b)(1) of the Act, 21 U.S.C. section 360bbb-3(b)(1), unless the authorization is terminated or revoked sooner.  Performed at [facility], [address] [place], [place] [address]      Radiology Studies: No results found.   LOS: 4 days   Lanae Boast, MD [facility]  10/03/2020, 10:21 AM

## 2020-10-04 LAB — BASIC METABOLIC PANEL
Anion gap: 11 (ref 5–15)
BUN: 16 mg/dL (ref 8–23)
CO2: 23 mmol/L (ref 22–32)
Calcium: 8.4 mg/dL — ABNORMAL LOW (ref 8.9–10.3)
Chloride: 104 mmol/L (ref 98–111)
Creatinine, Ser: 0.59 mg/dL (ref 0.44–1.00)
GFR, Estimated: >60 mL/min (ref >60)
Glucose, Bld: 108 mg/dL — ABNORMAL HIGH (ref 70–99)
Potassium: 3.3 mmol/L — ABNORMAL LOW (ref 3.5–5.1)
Sodium: 138 mmol/L (ref 135–145)

## 2020-10-04 LAB — CBC
HCT: 27 % — ABNORMAL LOW (ref 36.0–46.0)
Hemoglobin: 9.1 g/dL — ABNORMAL LOW (ref 12.0–15.0)
MCH: 22.5 pg — ABNORMAL LOW (ref 26.0–34.0)
MCHC: 33.7 g/dL (ref 30.0–36.0)
MCV: 66.7 fL — ABNORMAL LOW (ref 80.0–100.0)
Platelets: 319 10*3/uL (ref 150–400)
RBC: 4.05 MIL/uL (ref 3.87–5.11)
RDW: 21.3 % — ABNORMAL HIGH (ref 11.5–15.5)
WBC: 13.1 10*3/uL — ABNORMAL HIGH (ref 4.0–10.5)
nRBC: 0.2 % (ref 0.0–0.2)

## 2020-10-04 MED ORDER — POTASSIUM CHLORIDE CRYS ER 20 MEQ PO TBCR
40.0000 meq | EXTENDED_RELEASE_TABLET | Freq: Once | ORAL | Status: AC
  Administered 2020-10-04: 40 meq via ORAL
  Filled 2020-10-04: qty 2

## 2020-10-04 NOTE — Progress Notes (Signed)
PROGRESS NOTE    Dawn Oneill  DDU:[id number] DOB: August 17, 1926 DOA: 09/29/2020 PCP: Patient, No Pcp Per   Chief Complaint  Patient presents with  . Hip Injury  . Fall  Brief Narrative: 88 year old female with a chronic microcytic anemia chronic back and knee pain carotid artery stenosis and AAA, hypothyroidism, hypertension, ongoing tobacco abuse presented to the ED with severe left hip pain and deformity after a fall at home when dog bumped into her and caused her to fall, without loss of consciousness. In the ED afebrile saturating well on room air vitals stable EKG sinus rhythm CT head negative for acute finding chest x-ray possible new pulmonary nodule with outpatient CT recommended, x-ray showed left hip fracture lab with significant leukocytosis anemia orthopedic was consulted and admitted for further management. 1/28- s/p intramedullary nail intertrochanteric on left. Had acute blood/anemia 1 unit.  1 unit prbc transfused 1/29 Episode of mild confusion 1/30 but overall stable since. PT OT working with her and recommending skilled nursing facility, [facility] consulted.  Subjective: Seen this morning Patient is very confused, Pulse ox dropped to 86% on room air placed on 2 L.  She is afebrile.  Able to tell me her name but she thinks she is at home.     Assessment & Plan:  Closed fracture of left hip: s/p intertrochanteric intramedullary nailing 1/28 by orthopedics.  On Lovenox for DVT prophylaxis while here and orthopedic plans for aspirin 81 mg twice daily upon discharge.  Continue PT OT today mobilize more check oxygen hopefully can discharge to SNF tomorrow   Microcytic anemia,chronic/history of thalassemia minor as per patient, with acute blood loss anemia due to #1 hemoglobin had significantly dropped to 7.2 g. s/p 1 unit PRBC, check cbc today. Transfuse if less than 7 gm. Cont iron supplement. Baseline 11.9 gm in may 2021. Recent Labs  Lab 09/29/20 1852 09/30/20 1434  10/01/20 0159 10/02/20 0230 10/03/20 0427  HGB 10.5* 7.8* 7.2* 8.3* 8.3*  HCT 33.6* 24.0* 22.3* 24.4* 24.6*   Leukocytosis: Unclear etiology,likely reactive from #1 and was downtrending, cbc pending today.No pneumonia on chest x-ray.s/p preop Ancef. Recent Labs  Lab 09/29/20 1852 09/30/20 1434 10/01/20 0159 10/02/20 0230 10/03/20 0427  WBC 23.9* 13.5* 12.1* 13.2* 12.0*   Acute hypoxic resp failure/insufficiency, spo2 86% RA today, suspect from postop status along with atelectasis. Placed on 2l [place],ambulate today w/ PT OT, encourage IS-but due to confusion she is not able to participate.  Hopefully can wean oxygen.  Acute metabolic encephalopathy-patient appears very confused today as per daughter she has been getting memory issues/sundowning recently but "nothing major" she is making sense after talking for sometinme. We will minimize any psychotropic meds/sleepign aid, she takes neurontin for knee pain and xanax prn at home for few years. Stop robaxin and norco.   Elevated TSH 9.5 free T4 1.1.  Continue Synthroid -adjust dose of Synthroid if TSH remains elevated in next 2 to 3 weeks  Lung nodule: Will need outpatient CT follow-up, if she desires to do so-will need to see her with PCP Daughter aware-I had discussed with her  Carotid artery stenosis/AAA asymptomatic she is followed by vascular surgery as outpatient.  Continue statin.    Essential hypertension: BP is controlled after reducing amlodipine to 5 mg from 10.    Anxiety/depression: Mood is stable continue on Zoloft, hold her Xanax due to confusion.  Deconditioning debility in the setting of fall multiple comorbidities anemia or legs.  Continue PT OT, awaiting skilled placement.  Nutrition: Diet Order            Diet Heart Room service appropriate? Yes; Fluid consistency: Thin  Diet effective now                Nutrition Problem: Increased nutrient needs Etiology: post-op healing Signs/Symptoms: estimated  needs Interventions: Ensure Enlive (each supplement provides 350kcal and 20 grams of protein),MVI  Body mass index is 20.12 kg/m.  DVT prophylaxis: enoxaparin (LOVENOX) injection 40 mg Start: 10/01/20 0800 SCDs Start: 09/30/20 1309 Code Status:   Code Status: Full Code  Family Communication: plan of care discussed with patient.,  I had spoke  w/ [name], [phone] 1/30, called again today and updated,all questions answered.  Status VF:IEPPIRJJO Remains inpatient appropriate because:Inpatient level of care appropriate due to severity of illness and For hip fracture management  Dispo: The patient is from: Home lives with daughter              Anticipated d/c is to: SNF              Anticipated d/c date is: Tomorrow if mental status improves              Patient currently is not medically stable for discharge.    Difficult to place patient No Consultants:see note  Procedures:see note  Culture/Microbiology No results found for: SDES, SPECREQUEST, CULT, REPTSTATUS  Other culture-see note  Medications: Scheduled Meds: . amLODipine  5 mg Oral Daily  . aspirin EC  81 mg Oral Daily  . atorvastatin  10 mg Oral Daily  . baclofen  5 mg Oral Daily  . docusate sodium  100 mg Oral BID  . enoxaparin (LOVENOX) injection  40 mg Subcutaneous Q24H  . feeding supplement  237 mL Oral BID BM  . ferrous sulfate  325 mg Oral Q breakfast  . gabapentin  100 mg Oral BID  . levothyroxine  75 mcg Oral Q0600  . senna  1 tablet Oral BID  . sertraline  100 mg Oral Daily   Continuous Infusions: . methocarbamol (ROBAXIN) IV      Antimicrobials: Anti-infectives (From admission, onward)   Start     Dose/Rate Route Frequency Ordered Stop   09/30/20 1315  ceFAZolin (ANCEF) IVPB 2g/100 mL premix        2 g 200 mL/hr over 30 Minutes Intravenous Every 6 hours 09/30/20 1308 09/30/20 2000   09/30/20 0918  ceFAZolin (ANCEF) 2-4 GM/100ML-% IVPB       Note to Pharmacy: Payton Emerald   : cabinet override       09/30/20 0918 09/30/20 1037   09/30/20 0915  ceFAZolin (ANCEF) IVPB 2g/100 mL premix        2 g 200 mL/hr over 30 Minutes Intravenous On call to O.R. 09/30/20 0911 09/30/20 1100     Objective: Vitals: Today's Vitals   10/04/20 0752 10/04/20 0830 10/04/20 0835 10/04/20 1303  BP: 132/68   (!) 134/52  Pulse: 81   77  Resp: 17     Temp: 98.5 F (36.9 C)   97.8 F (36.6 C)  TempSrc: Oral   Oral  SpO2: 97% (!) 86% 92% 95%  Weight:      Height:      PainSc:        Intake/Output Summary (Last 24 hours) at 10/04/2020 1358 Last data filed at 10/04/2020 0930 Gross per 24 hour  Intake 320 ml  Output --  Net 320 ml   Filed Weights   09/29/20 1840  Weight: 49.9 kg   Weight change:   Intake/Output from previous day: 01/31 0701 - 02/01 0700 In: -  Out: 2 [Urine:1; Stool:1] Intake/Output this shift: Total I/O In: 320 [Blood:320] Out: -  Filed Weights   09/29/20 1840  Weight: 49.9 kg    Examination: General exam: AAOx1-2, elderly frail, on 2 L nasal cannula. HEENT:Oral mucosa moist, Ear/Nose WNL grossly, dentition normal. Respiratory system: bilaterally diminished,no use of accessory muscle Cardiovascular system: S1 & S2 +, No JVD,. Gastrointestinal system: Abdomen soft, NT,ND, BS+ Nervous System:Alert, awake, she is able to move all her extremities and nonfocal.  Extremities: No edema, distal peripheral pulses palpable.  Left hip surgical site with intact dressing Skin: No rashes,no icterus. MSK: thin muscle bulk,tone, power  Data Reviewed: I have personally reviewed following labs and imaging studies CBC: Recent Labs  Lab 09/29/20 1852 09/30/20 1434 10/01/20 0159 10/02/20 0230 10/03/20 0427  WBC 23.9* 13.5* 12.1* 13.2* 12.0*  NEUTROABS 22.5*  --   --   --   --   HGB 10.5* 7.8* 7.2* 8.3* 8.3*  HCT 33.6* 24.0* 22.3* 24.4* 24.6*  MCV 62.7* 63.0* 63.0* 67.0* 67.0*  PLT 306 214 187 185 243   Basic Metabolic Panel: Recent Labs  Lab 09/30/20 1434 10/01/20 0159  10/02/20 0230 10/03/20 0427 10/04/20 0322  NA 136 135 133* 137 138  K 4.2 3.7 4.0 3.7 3.3*  CL 103 102 100 104 104  CO2 25 22 23 23 23   GLUCOSE 127* 106* 105* 101* 108*  BUN 10 13 20 18 16   CREATININE 0.83 0.77 0.72 0.66 0.59  CALCIUM 8.0* 8.0* 8.0* 8.1* 8.4*   GFR: Estimated Creatinine Clearance: 35.3 mL/min (by C-G formula based on SCr of 0.59 mg/dL). Liver Function Tests: Recent Labs  Lab 09/29/20 1852  AST 21  ALT 14  ALKPHOS 83  BILITOT 1.2  PROT 6.2*  ALBUMIN 3.5   No results for input(s): LIPASE, AMYLASE in the last 168 hours. No results for input(s): AMMONIA in the last 168 hours. Coagulation Profile: Recent Labs  Lab 09/29/20 1852  INR 1.3*   Cardiac Enzymes: No results for input(s): CKTOTAL, CKMB, CKMBINDEX, TROPONINI in the last 168 hours. BNP (last 3 results) No results for input(s): PROBNP in the last 8760 hours. HbA1C: No results for input(s): HGBA1C in the last 72 hours. CBG: No results for input(s): GLUCAP in the last 168 hours. Lipid Profile: No results for input(s): CHOL, HDL, LDLCALC, TRIG, CHOLHDL, LDLDIRECT in the last 72 hours. Thyroid Function Tests: No results for input(s): TSH, T4TOTAL, FREET4, T3FREE, THYROIDAB in the last 72 hours. Anemia Panel: No results for input(s): VITAMINB12, FOLATE, FERRITIN, TIBC, IRON, RETICCTPCT in the last 72 hours. Sepsis Labs: No results for input(s): PROCALCITON, LATICACIDVEN in the last 168 hours.  Recent Results (from the past 240 hour(s))  SARS Coronavirus 2 by RT PCR (hospital order, performed in [facility] hospital lab) Nasopharyngeal Nasopharyngeal Swab     Status: None   Collection Time: 09/29/20  6:52 PM   Specimen: Nasopharyngeal Swab  Result Value Ref Range Status   SARS Coronavirus 2 NEGATIVE NEGATIVE Final    Comment: (NOTE) SARS-CoV-2 target nucleic acids are NOT DETECTED.  The SARS-CoV-2 RNA is generally detectable in upper and lower respiratory specimens during the acute phase of  infection. The lowest concentration of SARS-CoV-2 viral copies this assay can detect is 250 copies / mL. A negative result does not preclude SARS-CoV-2 infection and should not be used as the sole basis  for treatment or other patient management decisions.  A negative result may occur with improper specimen collection / handling, submission of specimen other than nasopharyngeal swab, presence of viral mutation(s) within the areas targeted by this assay, and inadequate number of viral copies (<250 copies / mL). A negative result must be combined with clinical observations, patient history, and epidemiological information.  Fact Sheet for Patients:   BoilerBrush.com.cy  Fact Sheet for Healthcare Providers: https://pope.com/  This test is not yet approved or  cleared by the [place] FDA and has been authorized for detection and/or diagnosis of SARS-CoV-2 by FDA under an Emergency Use Authorization (EUA).  This EUA will remain in effect (meaning this test can be used) for the duration of the COVID-19 declaration under Section 564(b)(1) of the Act, 21 U.S.C. section 360bbb-3(b)(1), unless the authorization is terminated or revoked sooner.  Performed at [facility], [address] [place], [place] [address]   SARS CORONAVIRUS 2 (TAT 6-24 HRS) Nasopharyngeal Nasopharyngeal Swab     Status: None   Collection Time: 10/03/20  1:31 PM   Specimen: Nasopharyngeal Swab  Result Value Ref Range Status   SARS Coronavirus 2 NEGATIVE NEGATIVE Final    Comment: (NOTE) SARS-CoV-2 target nucleic acids are NOT DETECTED.  The SARS-CoV-2 RNA is generally detectable in upper and lower respiratory specimens during the acute phase of infection. Negative results do not preclude SARS-CoV-2 infection, do not rule out co-infections with other pathogens, and should not be used as the sole basis for treatment or other patient management  decisions. Negative results must be combined with clinical observations, patient history, and epidemiological information. The expected result is Negative.  Fact Sheet for Patients: HairSlick.no  Fact Sheet for Healthcare Providers: quierodirigir.com  This test is not yet approved or cleared by the [place] FDA and  has been authorized for detection and/or diagnosis of SARS-CoV-2 by FDA under an Emergency Use Authorization (EUA). This EUA will remain  in effect (meaning this test can be used) for the duration of the COVID-19 declaration under Se ction 564(b)(1) of the Act, 21 U.S.C. section 360bbb-3(b)(1), unless the authorization is terminated or revoked sooner.  Performed at [facility], [address] [place], [place] [address]      Radiology Studies: No results found.   LOS: 5 days   Lanae Boast, MD [facility]  10/04/2020, 1:58 PM

## 2020-10-04 NOTE — Progress Notes (Signed)
Occupational Therapy Treatment Patient Details Name: Dawn Oneill MRN: [id number] DOB: 08/16/1926 Today's Date: 10/04/2020    History of present illness 87 y.o. female with a past medical history of chronic microcytic anemia, chronic back and knee pain, carotid artery stenosis, AAA, hypothyroidism, hypertension, and tobacco abuse who presented to the emergency department after a mechanical fall at home. Patient sustained comminuted L peritrochanteric femur fx. Patient s/p IM nail of L femur on 1/28   OT comments  Pt making good progress with functional goals. Pt pleasantly confused and requires cues for safety and sequencing during ADL mobility using RW and for dressing tasks. OT will continue to follow acutely to maximize level of function and safety  Follow Up Recommendations  SNF;Supervision/Assistance - 24 hour    Equipment Recommendations  Other (comment) (TBD at SNF)    Recommendations for Other Services      Precautions / Restrictions Precautions Precautions: Fall Restrictions Weight Bearing Restrictions: Yes LLE Weight Bearing: Weight bearing as tolerated       Mobility Bed Mobility Overal bed mobility: Needs Assistance Bed Mobility: Supine to Sit     Supine to sit: Mod assist     General bed mobility comments: pt in recliner upon arrival  Transfers Overall transfer level: Needs assistance Equipment used: Rolling walker (2 wheeled) Transfers: Sit to/from [employer] Sit to Stand: Min assist Stand pivot transfers: Min assist       General transfer comment: Cues for safe hand placement. Pt able to transfer to [facility] with min A for steadying. Cues for sequencing using RW.    Balance Overall balance assessment: Needs assistance Sitting-balance support: Feet supported Sitting balance-Leahy Scale: Fair   Postural control: Posterior lean Standing balance support: Bilateral upper extremity supported;During functional activity Standing  balance-Leahy Scale: Poor Standing balance comment: reliant on UE and external support                           ADL either performed or assessed with clinical judgement   ADL Overall ADL's : Needs assistance/impaired         Upper Body Bathing: Sitting;Cueing for safety;Cueing for sequencing;Minimal assistance       Upper Body Dressing : Min guard;Sitting       Toilet Transfer: Moderate assistance;Minimal assistance;Ambulation;RW;BSC   Toileting- Clothing Manipulation and Hygiene: Moderate assistance;Cueing for safety;Cueing for sequencing;Sit to/from stand       Functional mobility during ADLs: Moderate assistance;Minimal assistance;Cueing for safety;Cueing for sequencing;Rolling walker General ADL Comments: needs cues for safety     Vision Patient Visual Report: No change from baseline     Perception     Praxis      Cognition Arousal/Alertness: Awake/alert Behavior During Therapy: WFL for tasks assessed/performed Overall Cognitive Status: Impaired/Different from baseline Area of Impairment: Orientation;Memory;Following commands;Safety/judgement;Awareness;Problem solving                 Orientation Level: Disoriented to;Place;Situation;Time   Memory: Decreased short-term memory;Decreased recall of precautions Following Commands: Follows one step commands with increased time;Follows multi-step commands inconsistently Safety/Judgement: Decreased awareness of deficits Awareness: Intellectual Problem Solving: Requires verbal cues;Requires tactile cues;Slow processing;Difficulty sequencing General Comments: pt pleasantly confused        Exercises Exercises: General Lower Extremity General Exercises - Lower Extremity Ankle Circles/Pumps: AROM;Both;Supine;10 reps Quad Sets: AROM;Left;10 reps;Supine Heel Slides: AAROM;Left;10 reps;Supine   Shoulder Instructions       General Comments      Pertinent Vitals/ Pain  Pain Assessment:  Faces Faces Pain Scale: Hurts little more Pain Location: L hip during mobility Pain Descriptors / Indicators: Grimacing;Guarding;Discomfort Pain Intervention(s): Limited activity within patient's tolerance;Monitored during session;Repositioned  Home Living                                          Prior Functioning/Environment              Frequency  Min 2X/week        Progress Toward Goals  OT Goals(current goals can now be found in the care plan section)  Progress towards OT goals: Progressing toward goals  Acute Rehab OT Goals Patient Stated Goal: none stated  Plan Discharge plan remains appropriate    Co-evaluation                 AM-PAC OT "6 Clicks" Daily Activity     Outcome Measure   Help from another person eating meals?: None Help from another person taking care of personal grooming?: A Little Help from another person toileting, which includes using toliet, bedpan, or urinal?: A Little Help from another person bathing (including washing, rinsing, drying)?: A Lot Help from another person to put on and taking off regular upper body clothing?: A Little Help from another person to put on and taking off regular lower body clothing?: A Lot 6 Click Score: 17    End of Session Equipment Utilized During Treatment: Gait belt;Rolling walker;Other (comment) (BSC)  OT Visit Diagnosis: Unsteadiness on feet (R26.81);Other abnormalities of gait and mobility (R26.89)   Activity Tolerance Patient tolerated treatment well   Patient Left in chair;with call bell/phone within reach;with chair alarm set;with nursing/sitter in room   Nurse Communication          Time: 6384-5364 OT Time Calculation (min): 29 min  Charges: OT General Charges $OT Visit: 1 Visit OT Treatments $Self Care/Home Management : 8-22 mins $Therapeutic Activity: 8-22 mins     Dawn Oneill 10/04/2020, 1:19 PM

## 2020-10-04 NOTE — Social Work (Signed)
Provided current SNF offers to pt's dtr [name] [phone] and she is agreeable to [facility] Dtr's preferred facility [facility] on bed lock due to COVID. Per MD, possible dc tomorrow. SW updated Sue Lush at [facility] who confirmed they are prepared to admit pt and request dc summary by noon. No additional COVID test needed for placement. SW will follow.   Dellie Burns, MSW, LCSW [phone] (coverage)

## 2020-10-04 NOTE — Plan of Care (Signed)

## 2020-10-04 NOTE — Plan of Care (Signed)
  Problem: Activity: Goal: Risk for activity intolerance will decrease Outcome: Progressing   Problem: Pain Managment: Goal: General experience of comfort will improve Outcome: Progressing   

## 2020-10-04 NOTE — Progress Notes (Signed)
Physical Therapy Treatment Patient Details Name: Dawn Oneill MRN: [id number] DOB: [birth year] Today's Date: 10/04/2020    History of Present Illness 87 y.o. female with a past medical history of chronic microcytic anemia, chronic back and knee pain, carotid artery stenosis, AAA, hypothyroidism, hypertension, and tobacco abuse who presented to the emergency department after a mechanical fall at home. Patient sustained comminuted L peritrochanteric femur fx. Patient s/p IM nail of L femur on 1/28    PT Comments    Pt progressing towards goals, however, continues to be limited secondary to pain. Requiring min to mod A for transfers and short distance gait with use of RW. Pt able to perform HEP with assist. Continues to be confused and talking about unrelated topics throughout session. Current recommendations appropriate. Will continue to follow acutely.     Follow Up Recommendations  SNF;Supervision/Assistance - 24 hour     Equipment Recommendations  Rolling walker with 5" wheels;Wheelchair (measurements PT);Wheelchair cushion (measurements PT);Hospital bed    Recommendations for Other Services       Precautions / Restrictions Precautions Precautions: Fall Restrictions Weight Bearing Restrictions: Yes LLE Weight Bearing: Weight bearing as tolerated    Mobility  Bed Mobility Overal bed mobility: Needs Assistance Bed Mobility: Supine to Sit     Supine to sit: Mod assist     General bed mobility comments: Mod assist for LLE translation, trunk elevation via HHA, and scooting to EOB with use of bed pad.  Transfers Overall transfer level: Needs assistance Equipment used: Rolling walker (2 wheeled) Transfers: Sit to/from [employer] Sit to Stand: Mod assist Stand pivot transfers: Min assist       General transfer comment: Mod A for lift assist and steadying assist to stand. Cues for safe hand placement. Pt able to transfer to [facility] with min A for  steadying. Cues for sequencing using RW.  Ambulation/Gait Ambulation/Gait assistance: Min assist Gait Distance (Feet): 5 Feet Assistive device: Rolling walker (2 wheeled) Gait Pattern/deviations: Step-to pattern;Decreased stance time - left;Antalgic;Decreased step length - left;Trunk flexed Gait velocity: Decreased   General Gait Details: Min A for steadying assist. Increased pain, which limited ambulation distance. Cues for sequencing using RW.   Stairs             Wheelchair Mobility    Modified Rankin (Stroke Patients Only)       Balance Overall balance assessment: Needs assistance Sitting-balance support: Feet supported Sitting balance-Leahy Scale: Fair     Standing balance support: Bilateral upper extremity supported;During functional activity Standing balance-Leahy Scale: Poor Standing balance comment: reliant on UE and external support                            Cognition Arousal/Alertness: Awake/alert Behavior During Therapy: WFL for tasks assessed/performed Overall Cognitive Status: Impaired/Different from baseline Area of Impairment: Orientation;Memory;Following commands;Safety/judgement;Awareness;Problem solving                 Orientation Level: Disoriented to;Place;Situation;Time   Memory: Decreased short-term memory;Decreased recall of precautions Following Commands: Follows one step commands with increased time;Follows multi-step commands inconsistently Safety/Judgement: Decreased awareness of deficits Awareness: Intellectual Problem Solving: Requires verbal cues;Requires tactile cues;Slow processing;Difficulty sequencing General Comments: Pt reporting she was at home and that it was "June 26". Pt talking about very off topic subjects throughout.      Exercises General Exercises - Lower Extremity Ankle Circles/Pumps: AROM;Both;Supine;10 reps Quad Sets: AROM;Left;10 reps;Supine Heel Slides: AAROM;Left;10 reps;Supine  General Comments        Pertinent Vitals/Pain Pain Assessment: Faces Faces Pain Scale: Hurts even more Pain Location: L hip during mobility Pain Descriptors / Indicators: Grimacing;Guarding;Discomfort Pain Intervention(s): Limited activity within patient's tolerance;Monitored during session;Repositioned    Home Living                      Prior Function            PT Goals (current goals can now be found in the care plan section) Acute Rehab PT Goals Patient Stated Goal: none stated PT Goal Formulation: With patient Time For Goal Achievement: 10/15/20 Potential to Achieve Goals: Fair Progress towards PT goals: Progressing toward goals    Frequency    Min 3X/week      PT Plan Current plan remains appropriate    Co-evaluation              AM-PAC PT "6 Clicks" Mobility   Outcome Measure  Help needed turning from your back to your side while in a flat bed without using bedrails?: A Lot Help needed moving from lying on your back to sitting on the side of a flat bed without using bedrails?: A Lot Help needed moving to and from a bed to a chair (including a wheelchair)?: A Little Help needed standing up from a chair using your arms (e.g., wheelchair or bedside chair)?: A Lot Help needed to walk in hospital room?: A Little Help needed climbing 3-5 steps with a railing? : A Lot 6 Click Score: 14    End of Session Equipment Utilized During Treatment: Gait belt;Oxygen Activity Tolerance: Patient limited by pain Patient left: in chair;with call bell/phone within reach;with chair alarm set Nurse Communication: Mobility status PT Visit Diagnosis: Unsteadiness on feet (R26.81);Muscle weakness (generalized) (M62.81);History of falling (Z91.81);Other abnormalities of gait and mobility (R26.89)     Time: 0912-0929 PT Time Calculation (min) (ACUTE ONLY): 17 min  Charges:  $Therapeutic Activity: 8-22 mins                     Cindee Salt, DPT  Acute  Rehabilitation Services  Pager: [phone] Office: [phone]    Lehman Prom 10/04/2020, 10:10 AM

## 2020-10-05 LAB — BASIC METABOLIC PANEL
Anion gap: 11 (ref 5–15)
BUN: 21 mg/dL (ref 8–23)
CO2: 23 mmol/L (ref 22–32)
Calcium: 8.8 mg/dL — ABNORMAL LOW (ref 8.9–10.3)
Chloride: 102 mmol/L (ref 98–111)
Creatinine, Ser: 0.6 mg/dL (ref 0.44–1.00)
GFR, Estimated: >60 mL/min (ref >60)
Glucose, Bld: 98 mg/dL (ref 70–99)
Potassium: 3.8 mmol/L (ref 3.5–5.1)
Sodium: 136 mmol/L (ref 135–145)

## 2020-10-05 LAB — HEMOGLOBIN AND HEMATOCRIT, BLOOD
HCT: 28.1 % — ABNORMAL LOW (ref 36.0–46.0)
Hemoglobin: 9.1 g/dL — ABNORMAL LOW (ref 12.0–15.0)

## 2020-10-05 MED ORDER — SENNOSIDES-DOCUSATE SODIUM 8.6-50 MG PO TABS: 1.0000 | ORAL_TABLET | Freq: Every evening | ORAL | Status: AC | PRN

## 2020-10-05 MED ORDER — ENSURE ENLIVE PO LIQD: 237.0000 mL | Freq: Two times a day (BID) | ORAL | 12 refills | Status: AC

## 2020-10-05 MED ORDER — FERROUS SULFATE 325 (65 FE) MG PO TABS: 325.0000 mg | ORAL_TABLET | Freq: Every day | ORAL | 3 refills | Status: AC

## 2020-10-05 NOTE — Progress Notes (Signed)
Nutrition Follow-up  DOCUMENTATION CODES:   Not applicable  INTERVENTION:   -Continue Ensure Enlive po BID, each supplement provides 350 kcal and 20 grams of protein -Continue MVI with minerals daily  NUTRITION DIAGNOSIS:   Increased nutrient needs related to post-op healing as evidenced by estimated needs.  Ongoing  GOAL:   Patient will meet greater than or equal to 90% of their needs  Progressing   MONITOR:   PO intake,Supplement acceptance,Diet advancement,Labs,Weight trends,Skin,I & O's  REASON FOR ASSESSMENT:   Consult Assessment of nutrition requirement/status,Hip fracture protocol  ASSESSMENT:   Dawn Oneill is a 87 y.o. female with medical history significant for chronic microcytic anemia, chronic back and knee pain, carotid artery stenosis and AAA, hypothyroidism, hypertension, and ongoing tobacco abuse, now presenting to the emergency department for evaluation of severe left hip pain and deformity after a fall at home.  Patient reports that she was in her usual state of health when her dog bumped into her and caused her to fall.  She denies hitting her head or losing consciousness but experienced immediate and severe pain at the left hip and was unable to bear weight.  Patient reports that she ambulates unassisted normally, has a bedroom on a second floor, since flight of stairs multiple times daily without any significant dyspnea.  She never experiences chest pain or diaphoresis with exertion.  She lives with her daughter, no longer cooks, but does laundry and other housework and is generally independent.  She continues to smoke almost 1 pack/day.  1/28- s/p PROCEDURE: Intramedullary fixation, Left femur.   Reviewed I/O's: +320 ml x 24 hours and +2.8 L since admission  Pt with fair oral intake. Noted meal completions 50%. Pt is consuming Ensure supplements.   Medications reviewed and include senokot.  Per MD notes, pt is medically stable for discharge  and awaiting SNF placement.   Labs reviewed.   NUTRITION - FOCUSED PHYSICAL EXAM:  Flowsheet Row Most Recent Value  Orbital Region No depletion  Upper Arm Region No depletion  Thoracic and Lumbar Region No depletion  Buccal Region No depletion  Temple Region No depletion  Clavicle Bone Region No depletion  Clavicle and Acromion Bone Region No depletion  Scapular Bone Region No depletion  Dorsal Hand No depletion  Patellar Region No depletion  Anterior Thigh Region No depletion  Posterior Calf Region No depletion  Edema (RD Assessment) None  Hair Reviewed  Eyes Reviewed  Mouth Reviewed  Skin Reviewed  Nails Reviewed       Diet Order:   Diet Order            Diet Heart Room service appropriate? Yes; Fluid consistency: Thin  Diet effective now                 EDUCATION NEEDS:   No education needs have been identified at this time  Skin:  Skin Assessment: Reviewed RN Assessment  Last BM:  Unknown  Height:   Ht Readings from Last 1 Encounters:  09/29/20 5\' 2"  (1.575 m)    Weight:   Wt Readings from Last 1 Encounters:  10/05/20 51.2 kg    Ideal Body Weight:  50 kg  BMI:  Body mass index is 20.65 kg/m.  Estimated Nutritional Needs:   Kcal:  1300-1500  Protein:  60-75 grams  Fluid:  > 1.3 L    12/03/20, RD, LDN, CDCES Registered Dietitian II Certified Diabetes Care and Education Specialist Please refer to [facility] for RD and/or  RD on-call/weekend/after hours pager

## 2020-10-05 NOTE — TOC Transition Note (Signed)
Transition of Care [facility]) - CM/SW Discharge Note   Patient Details  Name: Dawn Oneill MRN: [id number] Date of Birth: 01-Apr-1926  Transition of [facility]) CM/SW Contact:  Mearl Latin, LCSW Phone Number: 10/05/2020, 11:45 AM   Clinical Narrative:    Patient will DC to: [facility] SNF Anticipated DC date: 10/05/20 Family notified: Daughter, Multimedia programmer by: Tressie Ellis Transportation  Per MD patient ready for DC to [facility]. RN to call report prior to discharge [phone], Room 108). RN, patient, patient's family, and facility notified of DC. Discharge Summary and FL2 sent to facility. DC packet on chart. Ambulance transport requested for patient.   CSW will sign off for now as social work intervention is no longer needed. Please consult [place] again if new needs arise.      Final next level of care: Skilled Nursing Facility Barriers to Discharge: No Barriers Identified   Patient Goals and CMS Choice Patient states their goals for this hospitalization and ongoing recovery are:: Rehab CMS Medicare.gov Compare Post Acute Care list provided to:: Patient Choice offered to / list presented to : Adult Children  Discharge Placement   Existing PASRR number confirmed : 10/05/20          Patient chooses bed at: [facility] Patient to be transferred to facility by: [facility] Name of family member notified: Daughter Patient and family notified of of transfer: 10/05/20  Discharge Plan and Services In-house Referral: Clinical Social Work   Post Acute Care Choice: Skilled Nursing Facility                               Social Determinants of Health (SDOH) Interventions     Readmission Risk Interventions No flowsheet data found.

## 2020-10-05 NOTE — Progress Notes (Signed)
Physical Therapy Treatment Patient Details Name: Dawn Oneill MRN: [id number] DOB: [birth year] Today's Date: 10/05/2020    History of Present Illness 87 y.o. female with a past medical history of chronic microcytic anemia, chronic back and knee pain, carotid artery stenosis, AAA, hypothyroidism, hypertension, and tobacco abuse who presented to the emergency department after a mechanical fall at home. Patient sustained comminuted L peritrochanteric femur fx. Patient s/p IM nail of L femur on 1/28    PT Comments    Pt progressing towards goals, however, remains limited by pain. Pt able to tolerate slight increase in gait distance. Continues to present with cognitive deficits and requires safety cues throughout. Current recommendations appropriate. Will continue to follow acutely.     Follow Up Recommendations  SNF;Supervision/Assistance - 24 hour     Equipment Recommendations  Rolling walker with 5" wheels;Wheelchair (measurements PT);Wheelchair cushion (measurements PT);Hospital bed    Recommendations for Other Services       Precautions / Restrictions Precautions Precautions: Fall Restrictions Weight Bearing Restrictions: Yes LLE Weight Bearing: Weight bearing as tolerated    Mobility  Bed Mobility               General bed mobility comments: In chair upon entry  Transfers Overall transfer level: Needs assistance Equipment used: Rolling walker (2 wheeled) Transfers: Sit to/from [employer] Sit to Stand: Mod assist Stand pivot transfers: Min assist       General transfer comment: Mod A for lift assist and steadying assist to stand. Cues for safe hand placement. Pt requiring increased time to transfer to [facility] and reporting increased pain with transfers.  Ambulation/Gait Ambulation/Gait assistance: Min assist Gait Distance (Feet): 10 Feet Assistive device: Rolling walker (2 wheeled) Gait Pattern/deviations: Step-to pattern;Decreased stance  time - left;Antalgic;Decreased step length - left;Trunk flexed Gait velocity: Decreased   General Gait Details: Min A for steadying assist. Increased pain, which limited ambulation distance. Required assist for RW management. Pt reporting "I'm going to have to turn around" after very short distance ambulation in room.   Stairs             Wheelchair Mobility    Modified Rankin (Stroke Patients Only)       Balance Overall balance assessment: Needs assistance Sitting-balance support: Feet supported Sitting balance-Leahy Scale: Fair     Standing balance support: Bilateral upper extremity supported;During functional activity Standing balance-Leahy Scale: Poor Standing balance comment: reliant on UE and external support                            Cognition Arousal/Alertness: Awake/alert Behavior During Therapy: WFL for tasks assessed/performed Overall Cognitive Status: Impaired/Different from baseline Area of Impairment: Orientation;Memory;Following commands;Safety/judgement;Awareness;Problem solving                 Orientation Level: Disoriented to;Place;Situation;Time   Memory: Decreased short-term memory;Decreased recall of precautions Following Commands: Follows one step commands with increased time;Follows multi-step commands inconsistently Safety/Judgement: Decreased awareness of deficits Awareness: Intellectual Problem Solving: Requires verbal cues;Requires tactile cues;Slow processing;Difficulty sequencing        Exercises General Exercises - Lower Extremity Ankle Circles/Pumps: AROM;Both;Supine;20 reps Quad Sets: AROM;Left;10 reps;Supine Long Arc Quad: AROM;Left;10 reps;Seated    General Comments        Pertinent Vitals/Pain Pain Assessment: Faces Faces Pain Scale: Hurts even more Pain Location: L hip during mobility Pain Descriptors / Indicators: Grimacing;Guarding;Discomfort Pain Intervention(s): Limited activity within patient's  tolerance;Monitored during session;Repositioned  Home Living                      Prior Function            PT Goals (current goals can now be found in the care plan section) Acute Rehab PT Goals Patient Stated Goal: none stated PT Goal Formulation: With patient Time For Goal Achievement: 10/15/20 Potential to Achieve Goals: Fair Progress towards PT goals: Progressing toward goals    Frequency    Min 3X/week      PT Plan Current plan remains appropriate    Co-evaluation              AM-PAC PT "6 Clicks" Mobility   Outcome Measure  Help needed turning from your back to your side while in a flat bed without using bedrails?: A Lot Help needed moving from lying on your back to sitting on the side of a flat bed without using bedrails?: A Lot Help needed moving to and from a bed to a chair (including a wheelchair)?: A Little Help needed standing up from a chair using your arms (e.g., wheelchair or bedside chair)?: A Lot Help needed to walk in hospital room?: A Little Help needed climbing 3-5 steps with a railing? : A Lot 6 Click Score: 14    End of Session Equipment Utilized During Treatment: Gait belt Activity Tolerance: Patient limited by pain Patient left: in chair;with call bell/phone within reach;with chair alarm set Nurse Communication: Mobility status PT Visit Diagnosis: Unsteadiness on feet (R26.81);Muscle weakness (generalized) (M62.81);History of falling (Z91.81);Other abnormalities of gait and mobility (R26.89)     Time: 3086-5784 PT Time Calculation (min) (ACUTE ONLY): 15 min  Charges:  $Therapeutic Activity: 8-22 mins                     Cindee Salt, DPT  Acute Rehabilitation Services  Pager: [phone] Office: [phone]    Lehman Prom 10/05/2020, 1:05 PM

## 2020-10-05 NOTE — Care Management Important Message (Signed)
Important Message  Patient Details  Name: Dawn Oneill MRN: [id number] Date of Birth: [birth year]   Medicare Important Message Given:  Yes     [name] [name] 10/05/2020, 2:29 PM

## 2020-10-05 NOTE — Plan of Care (Signed)
  Problem: Activity: Goal: Risk for activity intolerance will decrease Outcome: Progressing   Problem: Elimination: Goal: Will not experience complications related to urinary retention Outcome: Completed/Met

## 2020-10-05 NOTE — Progress Notes (Signed)
Report called to Sam at [facility], all questions answered. Scripts and AVS on chart. Pt belongings gathered to be sent with her. Pt waiting on [facility] for transport.

## 2020-10-05 NOTE — TOC Progression Note (Signed)
Transition of Care [facility]) - Progression Note    Patient Details  Name: Dawn Oneill MRN: [id number] Date of Birth: Aug 17, 1926  [facility]) CM/SW Contact  Mearl Latin, LCSW Phone Number: 10/05/2020, 11:28 AM  Clinical Narrative:    CSW spoke with patient's daughter regarding discharge to SNF today. She reported agreement with plan and has spoken with the patient as well. CSW will arrange transportation.    Expected Discharge Plan: Skilled Nursing Facility Barriers to Discharge: No Barriers Identified  Expected Discharge Plan and Services Expected Discharge Plan: Skilled Nursing Facility In-house Referral: Clinical Social Work   Post Acute Care Choice: Skilled Nursing Facility Living arrangements for the past 2 months: Single Family Home Expected Discharge Date: 10/05/20                                     Social Determinants of Health (SDOH) Interventions    Readmission Risk Interventions No flowsheet data found.

## 2020-10-06 ENCOUNTER — Encounter (HOSPITAL_COMMUNITY): Payer: Self-pay | Admitting: Orthopedic Surgery

## 2020-10-06 DIAGNOSIS — E441 Mild protein-calorie malnutrition: Secondary | ICD-10-CM

## 2020-10-06 DIAGNOSIS — G451 Carotid artery syndrome (hemispheric): Secondary | ICD-10-CM

## 2020-10-06 DIAGNOSIS — F39 Unspecified mood [affective] disorder: Secondary | ICD-10-CM

## 2020-10-06 DIAGNOSIS — I10 Essential (primary) hypertension: Secondary | ICD-10-CM | POA: Diagnosis not present

## 2020-10-06 DIAGNOSIS — F05 Delirium due to known physiological condition: Secondary | ICD-10-CM | POA: Diagnosis not present

## 2020-10-06 DIAGNOSIS — G3184 Mild cognitive impairment, so stated: Secondary | ICD-10-CM

## 2020-10-06 DIAGNOSIS — S7292XA Unspecified fracture of left femur, initial encounter for closed fracture: Secondary | ICD-10-CM

## 2020-10-06 DIAGNOSIS — J9601 Acute respiratory failure with hypoxia: Secondary | ICD-10-CM | POA: Diagnosis not present

## 2020-10-12 DIAGNOSIS — R079 Chest pain, unspecified: Secondary | ICD-10-CM | POA: Diagnosis not present

## 2022-06-01 IMAGING — CT CT HEAD W/O CM
3 series · 14 of 46 positions shown, 16 images · non-contrast
Comparison: None.

CLINICAL DATA: Fall.  Head and neck trauma.

EXAM:
CT HEAD WITHOUT CONTRAST
CT CERVICAL SPINE WITHOUT CONTRAST
TECHNIQUE: Multidetector CT imaging of the head and cervical spine was
performed following the standard protocol without intravenous
contrast. Multiplanar CT image reconstructions of the cervical spine
were also generated.

[Series 1: head 5.0 h30s · axial · 0.42mm/px · z∈[+1310,+1430]mm · 8 of 29 slices shown, 10 images]
[im 3/29  brain]
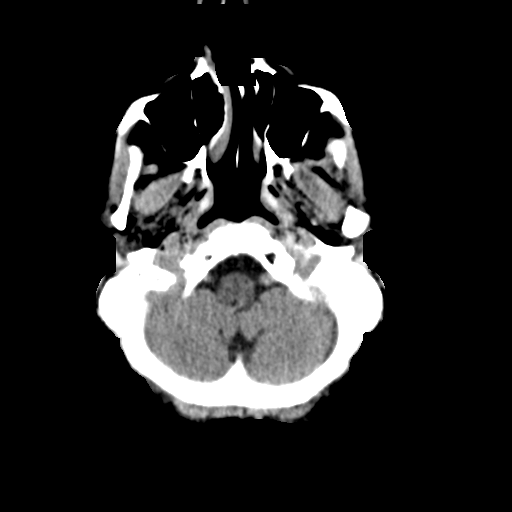
[im 3/29  bone]
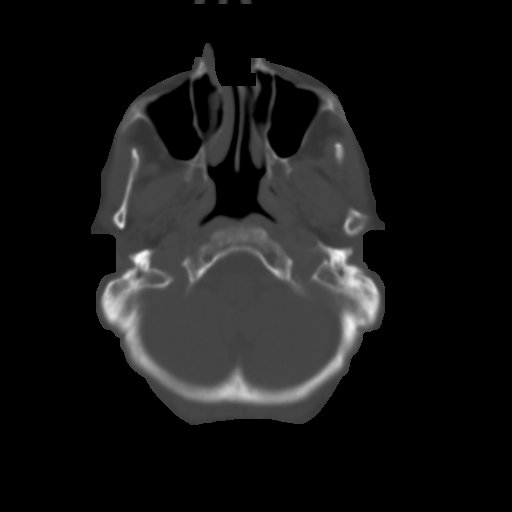
[im 7/29  brain]
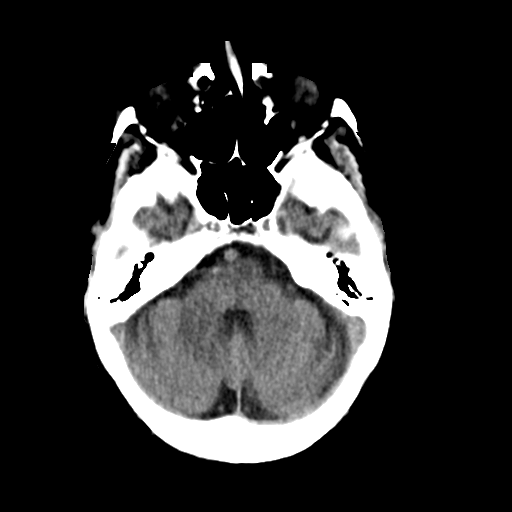
[im 10/29  brain]
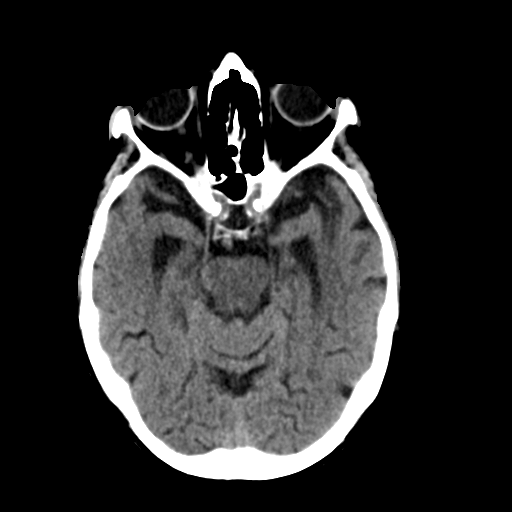
[im 13/29  brain]
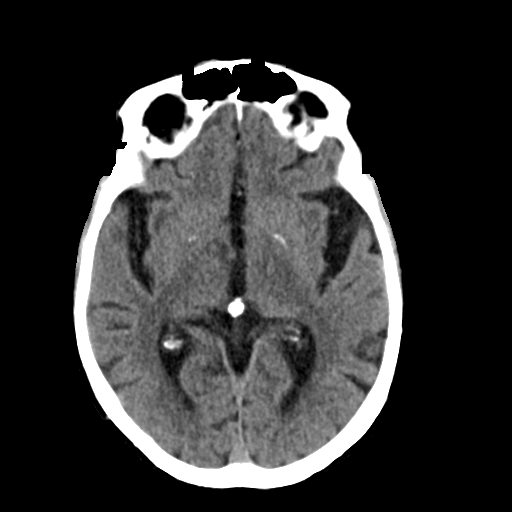
[im 17/29  brain]
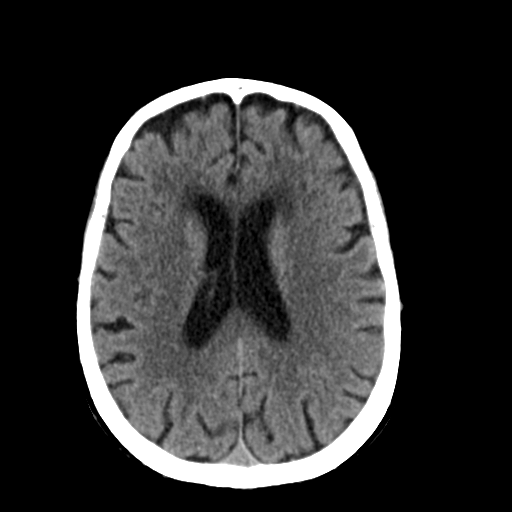
[im 17/29  bone]
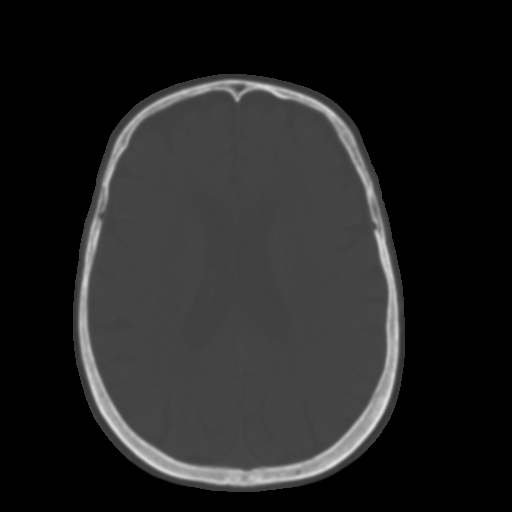
[im 20/29  brain]
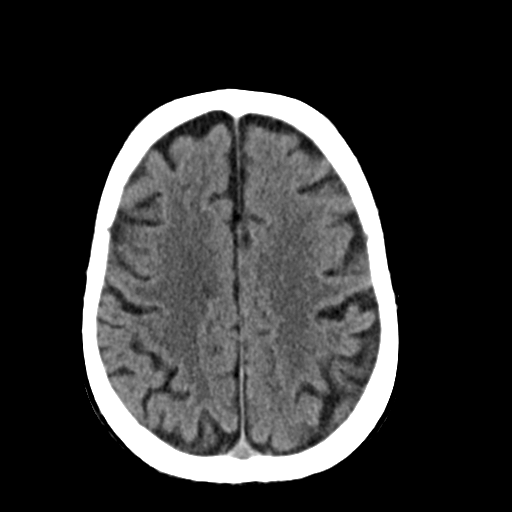
[im 23/29  brain]
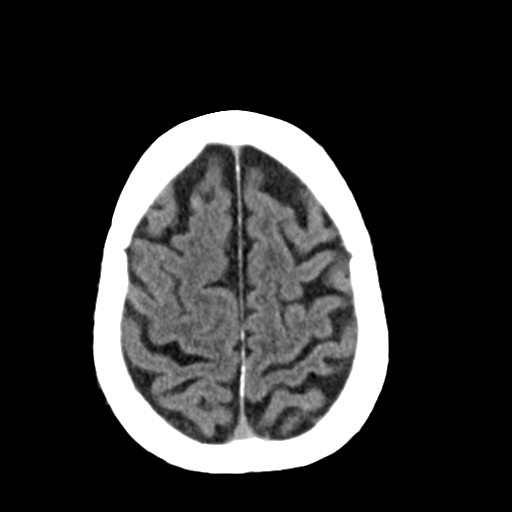
[im 27/29  brain]
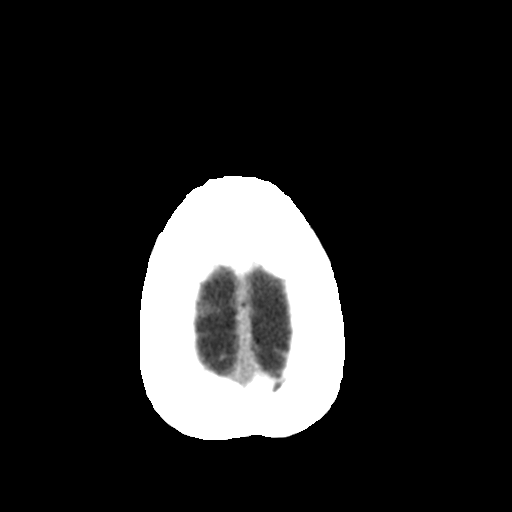

[Series 5: head 3.0 mpr cor · coronal · 0.31mm/px · 3 of 67 slices shown]
[im 23/67  brain]
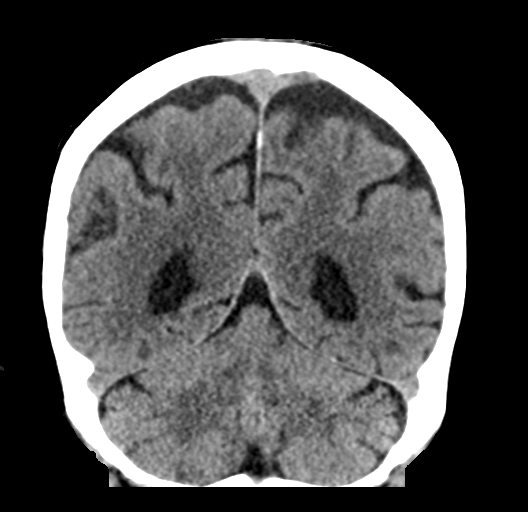
[im 30/67  brain]
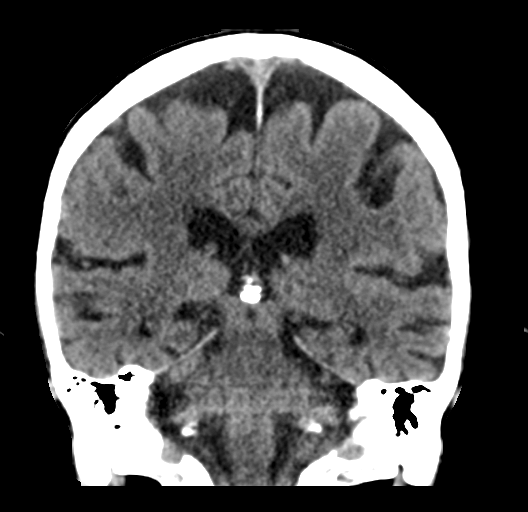
[im 37/67  brain]
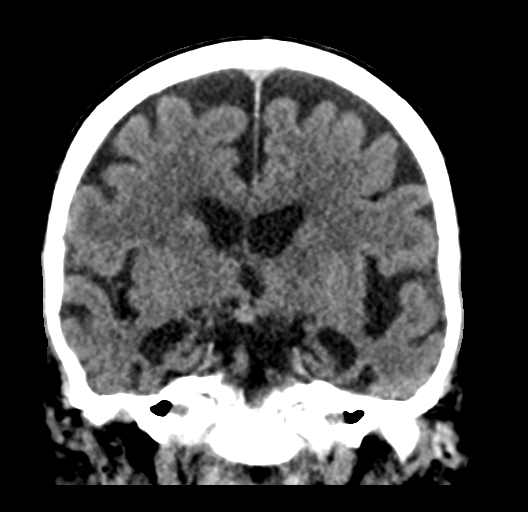

[Series 6: head 3.0 mpr sag · sagittal · 0.28mm/px · 3 of 67 slices shown]
[im 23/67  brain]
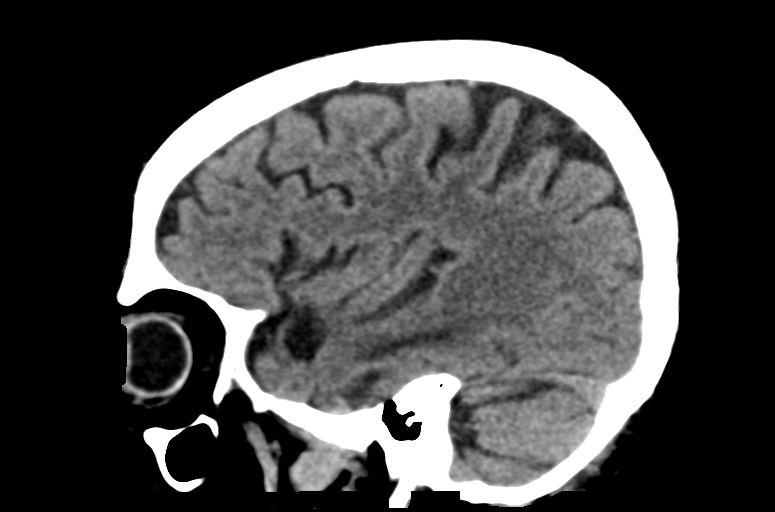
[im 34/67  brain]
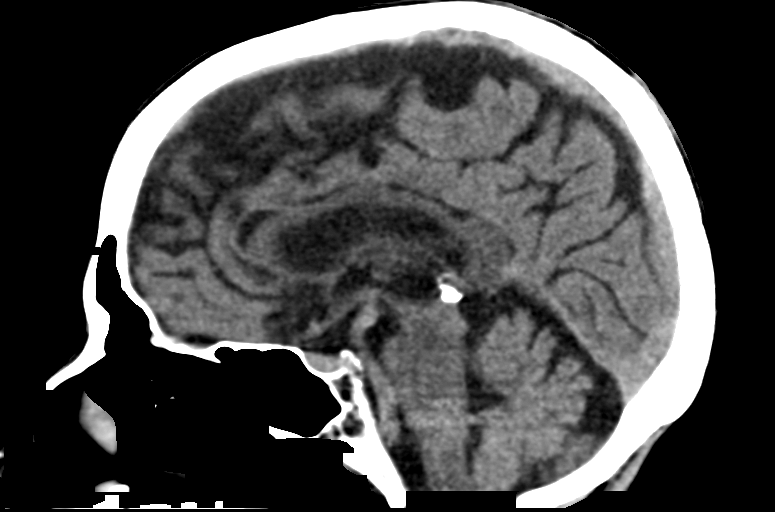
[im 45/67  brain]
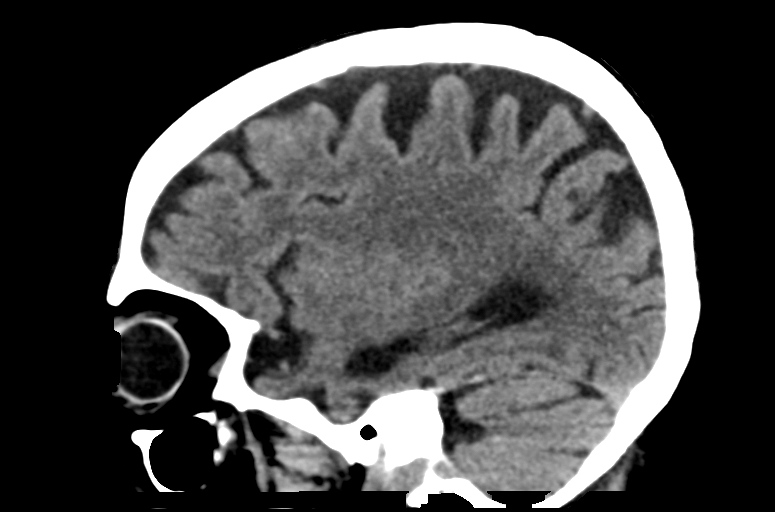

[14 of 46 positions shown; findings below may reference images not displayed]

FINDINGS: CT HEAD FINDINGS

Brain: Diffuse cerebral atrophy. Mild ventricular dilatation
consistent with central atrophy. Low-attenuation changes in the deep
white matter consistent with small vessel ischemia. No mass-effect
or midline shift. No abnormal extra-axial fluid collections.
Gray-white matter junctions are distinct. Basal cisterns are not
effaced. No acute intracranial hemorrhage.

Vascular: Mild intracranial arterial vascular calcifications.

Skull: Calvarium appears intact. Degenerative changes in the
temporomandibular joints.

Sinuses/Orbits: Paranasal sinuses and mastoid air cells are clear.

Other: None.

CT CERVICAL SPINE FINDINGS

Alignment: Normal alignment.

Skull base and vertebrae: Diffuse bone demineralization. Skull base
appears intact. No vertebral compression deformities. No focal bone
lesion or bone destruction. Bone cortex appears intact.

Soft tissues and spinal canal: No prevertebral soft tissue swelling.
No abnormal paraspinal soft tissue mass or infiltration.

Disc levels: Degenerative changes throughout the cervical spine with
narrowed interspaces and endplate hypertrophic changes throughout,
most prominent at C4-5 and C5-6 levels. Degenerative changes
throughout the facet joints.

Upper chest: Emphysematous changes in the lung apices. Vascular
calcifications. Calcified granulomas in the lung apices.

Other: None.
IMPRESSION: 1. No acute intracranial abnormalities. Chronic atrophy and small
vessel ischemic changes.
2. Normal alignment of the cervical spine. Diffuse bone
demineralization. Degenerative changes throughout the cervical
spine. No acute displaced fractures identified.
3. Emphysematous changes and calcified granulomas in the lung
apices.

Emphysema (299PS-GFN.U).

## 2022-06-01 IMAGING — CT CT CERVICAL SPINE W/O CM
3 series · 11 of 33 positions shown, 13 images · non-contrast
Comparison: None.

CLINICAL DATA: Fall.  Head and neck trauma.

EXAM:
CT HEAD WITHOUT CONTRAST
CT CERVICAL SPINE WITHOUT CONTRAST
TECHNIQUE: Multidetector CT imaging of the head and cervical spine was
performed following the standard protocol without intravenous
contrast. Multiplanar CT image reconstructions of the cervical spine
were also generated.

[Series 5: c_spine 2.0 st · axial · 0.37mm/px · z∈[+1159,+1293]mm · 3 of 108 slices shown, 4 images]
[im 25/108  soft-tissue]
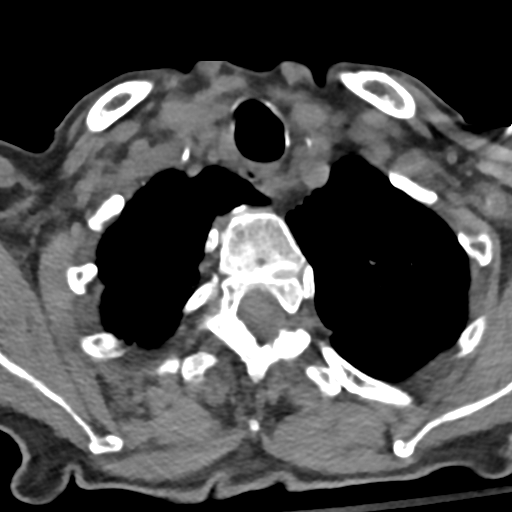
[im 25/108  bone]
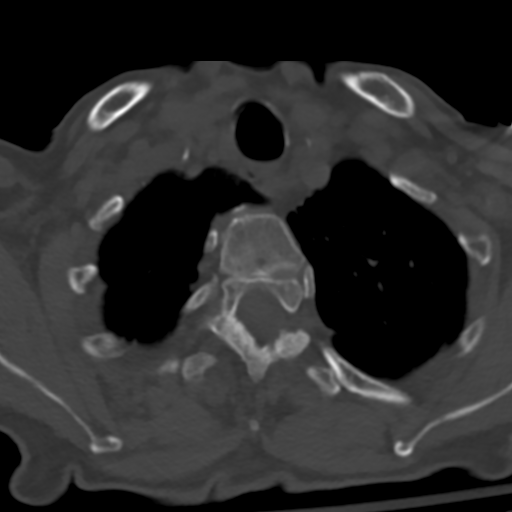
[im 58/108  bone]
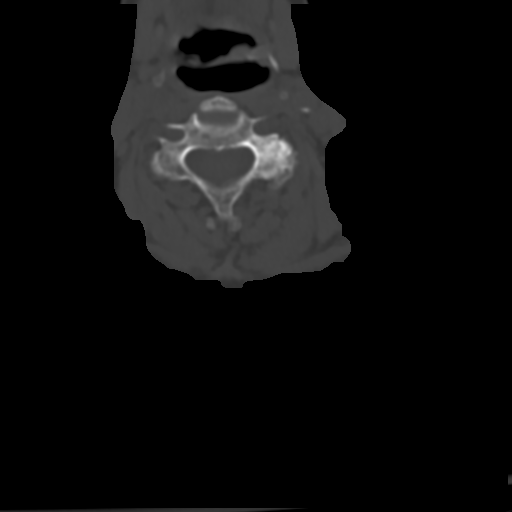
[im 91/108  bone]
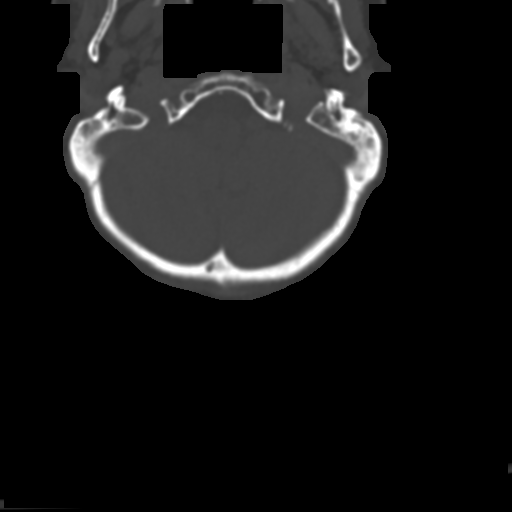

[Series 8: coronal bone · coronal · 0.29mm/px · 3 of 76 slices shown]
[im 16/76  bone]
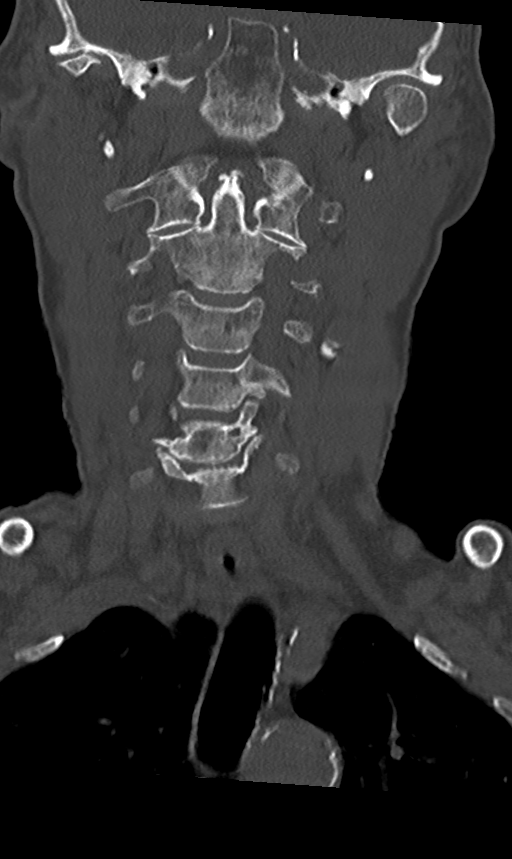
[im 31/76  bone]
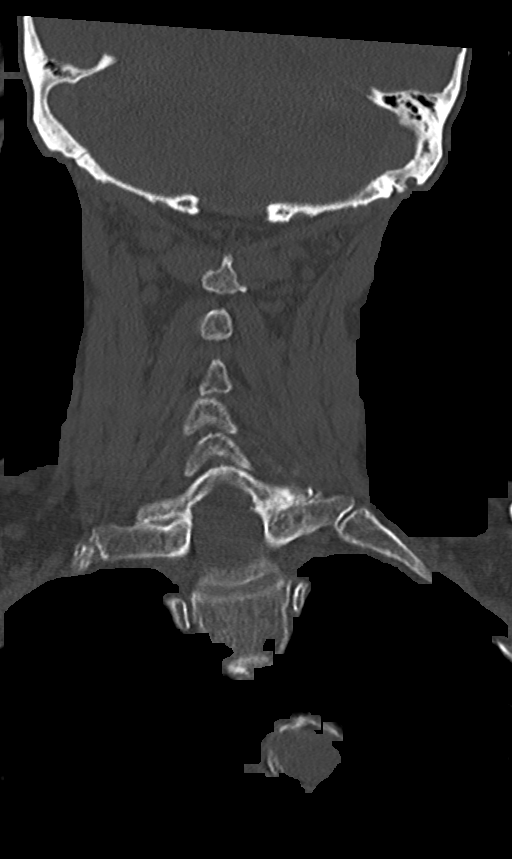
[im 46/76  bone]
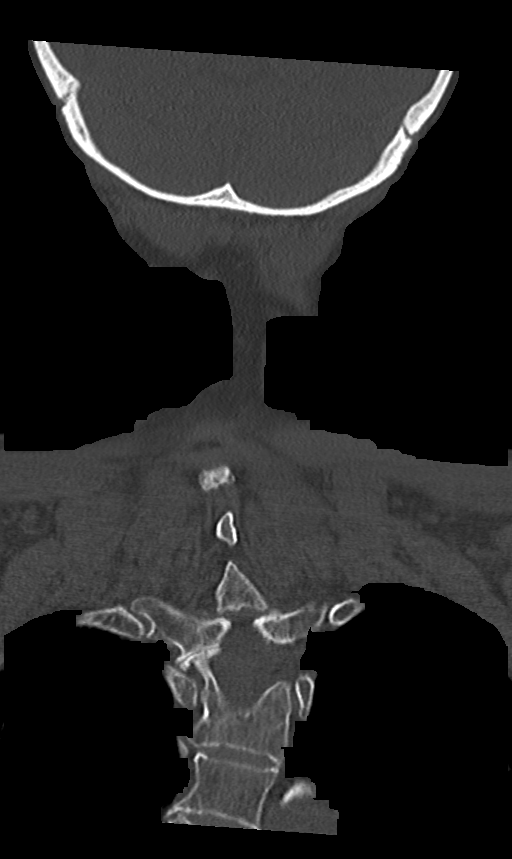

[Series 9: sagittal bone · sagittal · 0.29mm/px · 5 of 61 slices shown, 6 images]
[im 21/61  bone]
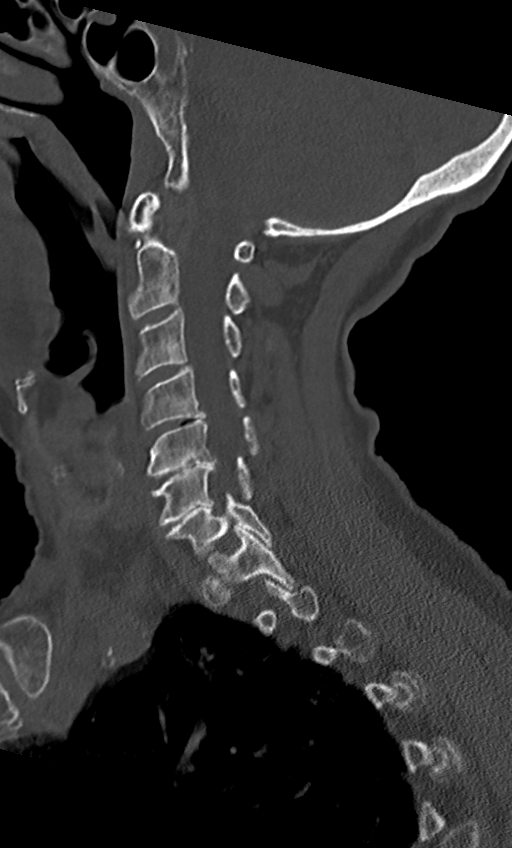
[im 26/61  bone]
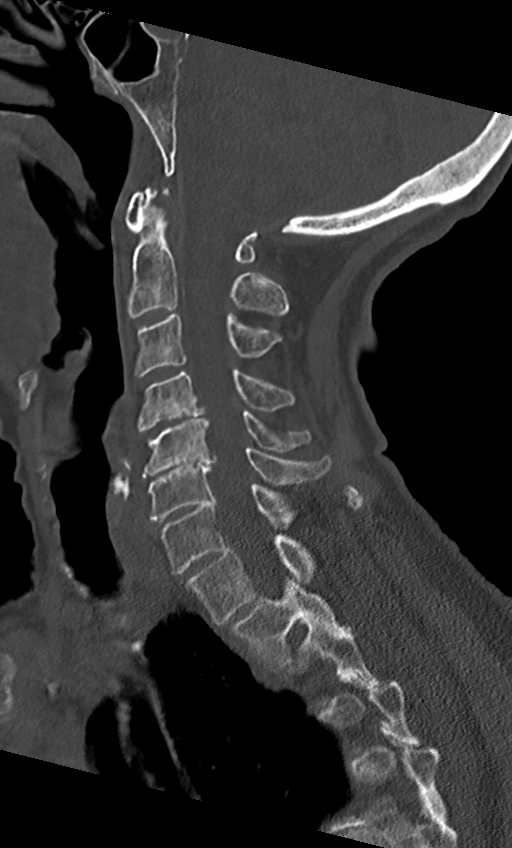
[im 31/61  soft-tissue]
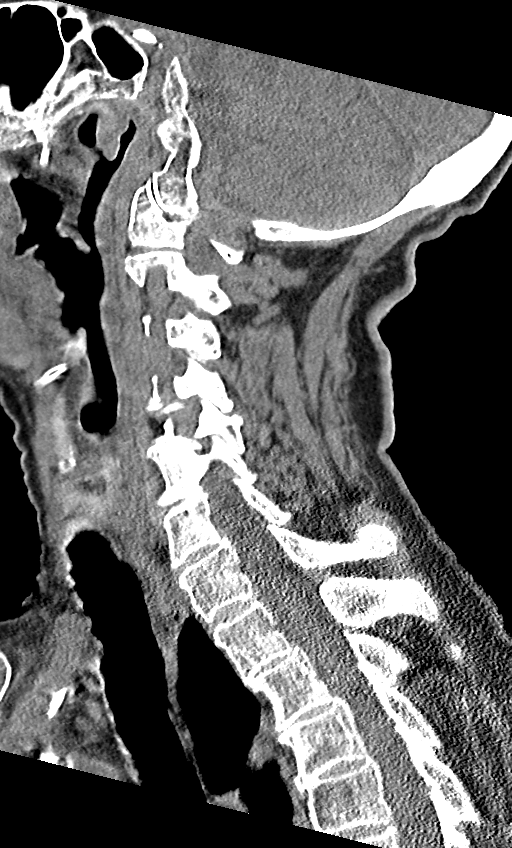
[im 31/61  bone]
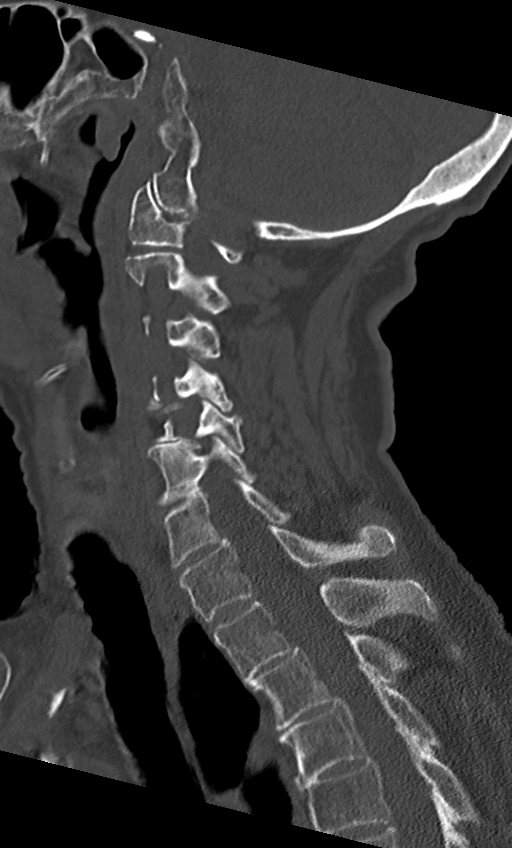
[im 36/61  bone]
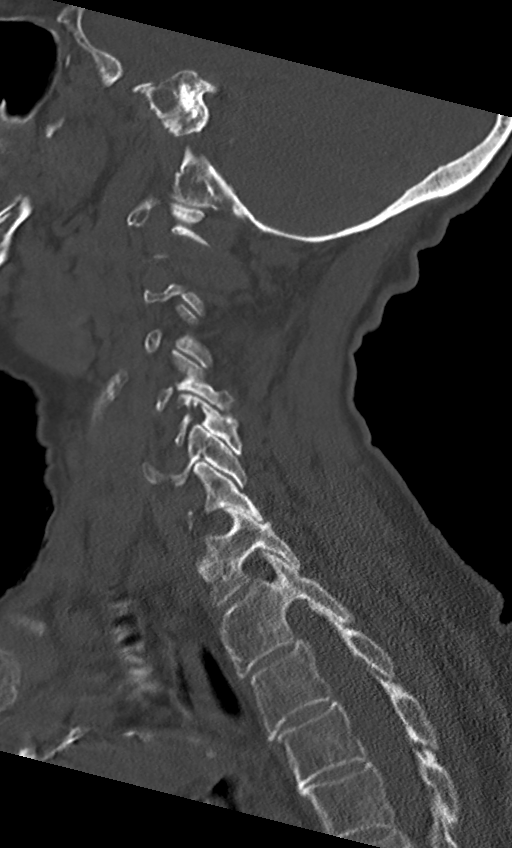
[im 41/61  bone]
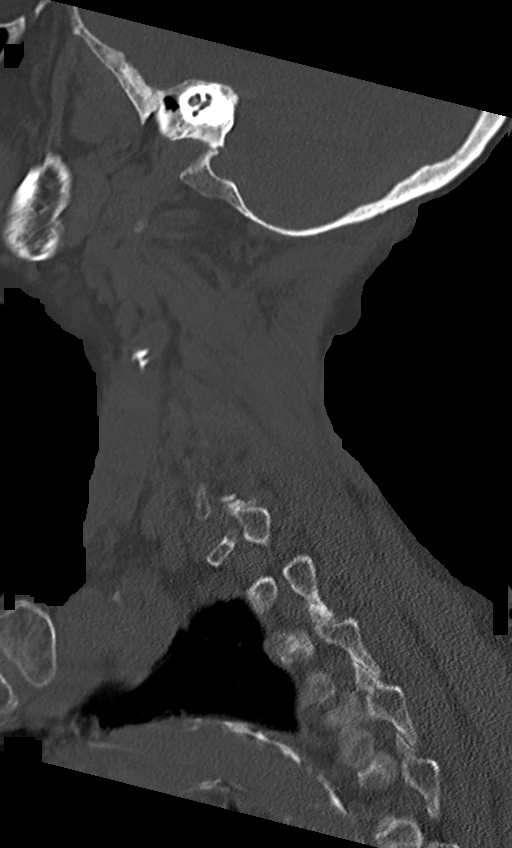

[11 of 33 positions shown; findings below may reference images not displayed]

FINDINGS: CT HEAD FINDINGS

Brain: Diffuse cerebral atrophy. Mild ventricular dilatation
consistent with central atrophy. Low-attenuation changes in the deep
white matter consistent with small vessel ischemia. No mass-effect
or midline shift. No abnormal extra-axial fluid collections.
Gray-white matter junctions are distinct. Basal cisterns are not
effaced. No acute intracranial hemorrhage.

Vascular: Mild intracranial arterial vascular calcifications.

Skull: Calvarium appears intact. Degenerative changes in the
temporomandibular joints.

Sinuses/Orbits: Paranasal sinuses and mastoid air cells are clear.

Other: None.

CT CERVICAL SPINE FINDINGS

Alignment: Normal alignment.

Skull base and vertebrae: Diffuse bone demineralization. Skull base
appears intact. No vertebral compression deformities. No focal bone
lesion or bone destruction. Bone cortex appears intact.

Soft tissues and spinal canal: No prevertebral soft tissue swelling.
No abnormal paraspinal soft tissue mass or infiltration.

Disc levels: Degenerative changes throughout the cervical spine with
narrowed interspaces and endplate hypertrophic changes throughout,
most prominent at C4-5 and C5-6 levels. Degenerative changes
throughout the facet joints.

Upper chest: Emphysematous changes in the lung apices. Vascular
calcifications. Calcified granulomas in the lung apices.

Other: None.
IMPRESSION: 1. No acute intracranial abnormalities. Chronic atrophy and small
vessel ischemic changes.
2. Normal alignment of the cervical spine. Diffuse bone
demineralization. Degenerative changes throughout the cervical
spine. No acute displaced fractures identified.
3. Emphysematous changes and calcified granulomas in the lung
apices.

Emphysema (299PS-GFN.U).

## 2022-06-02 IMAGING — RF DG FEMUR 2+V*L*
1 series · 6 of 6 positions shown · non-contrast
Comparison: Pelvic and left femur radiographs 09/29/2020

CLINICAL DATA: Left femur fracture ORIF.

EXAM:
DG C-ARM 1-60 MIN; LEFT FEMUR 2 VIEWS

[Series 1: run · 6 of 6 slices shown]
[im 1/6]
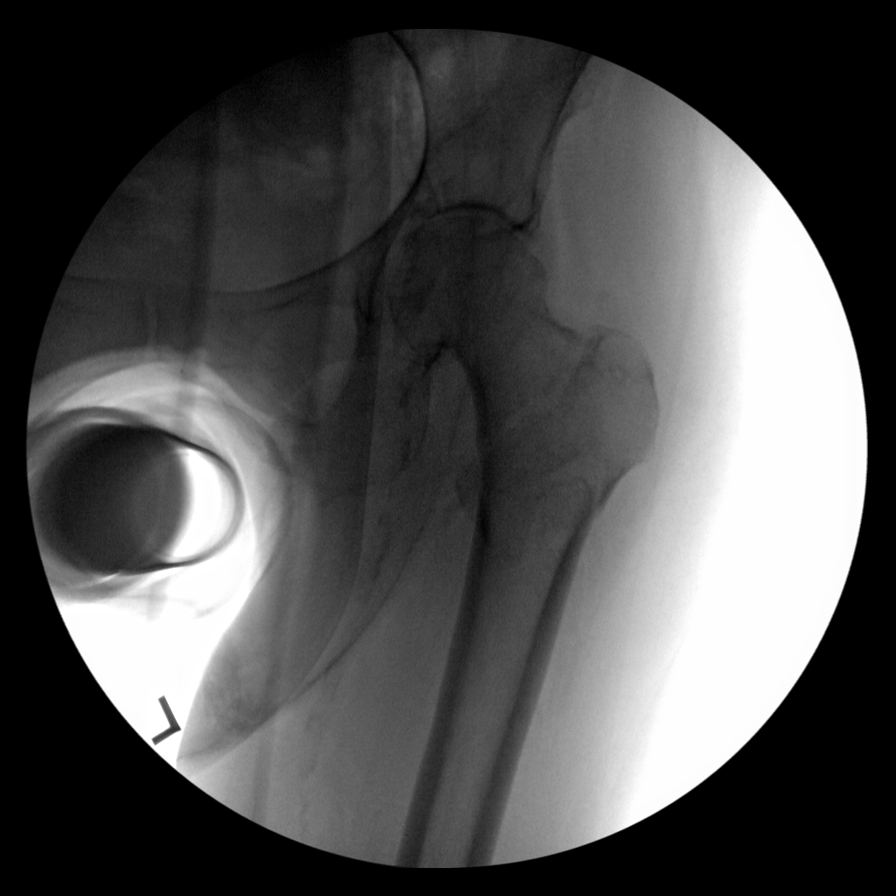
[im 2/6]
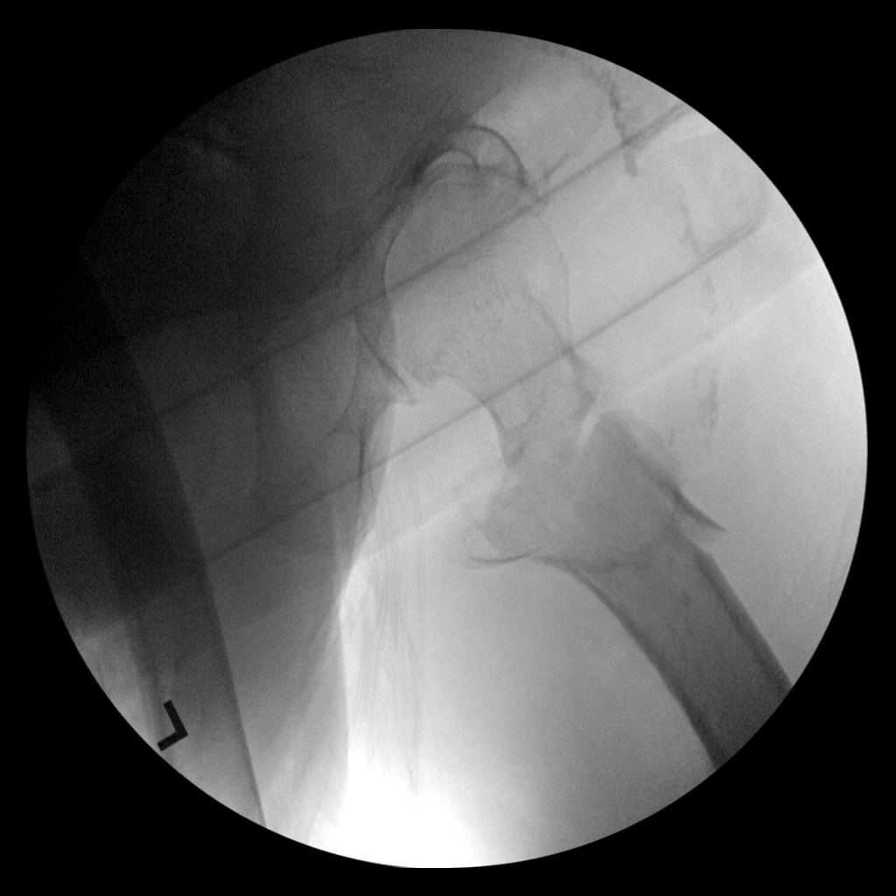
[im 3/6]
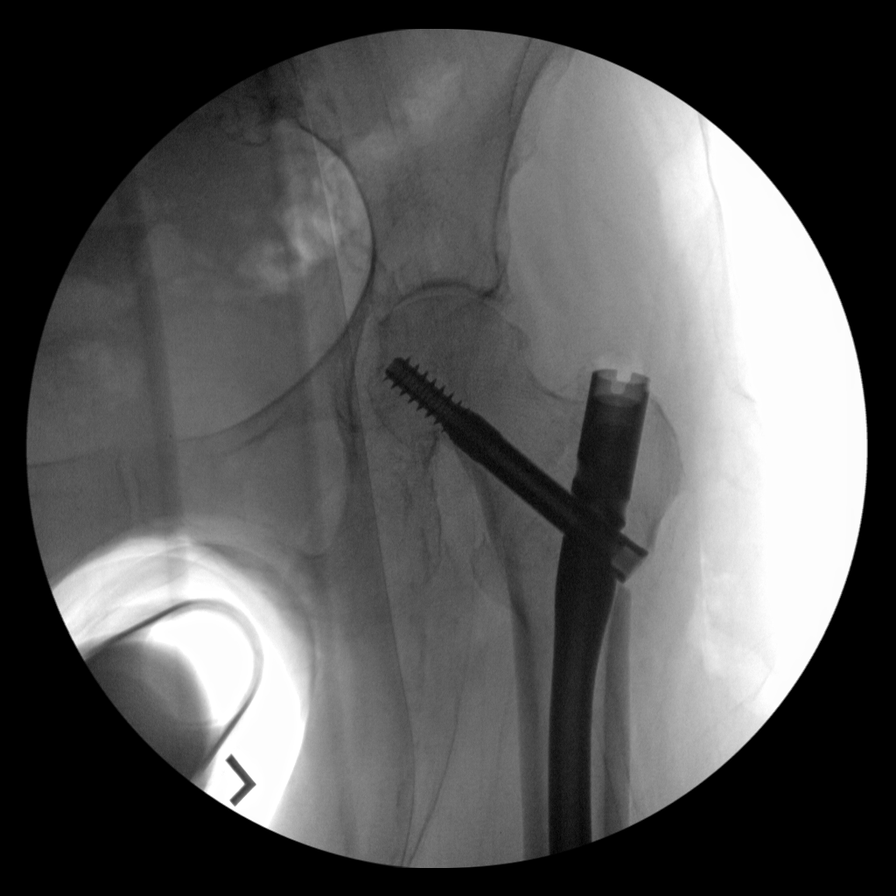
[im 4/6]
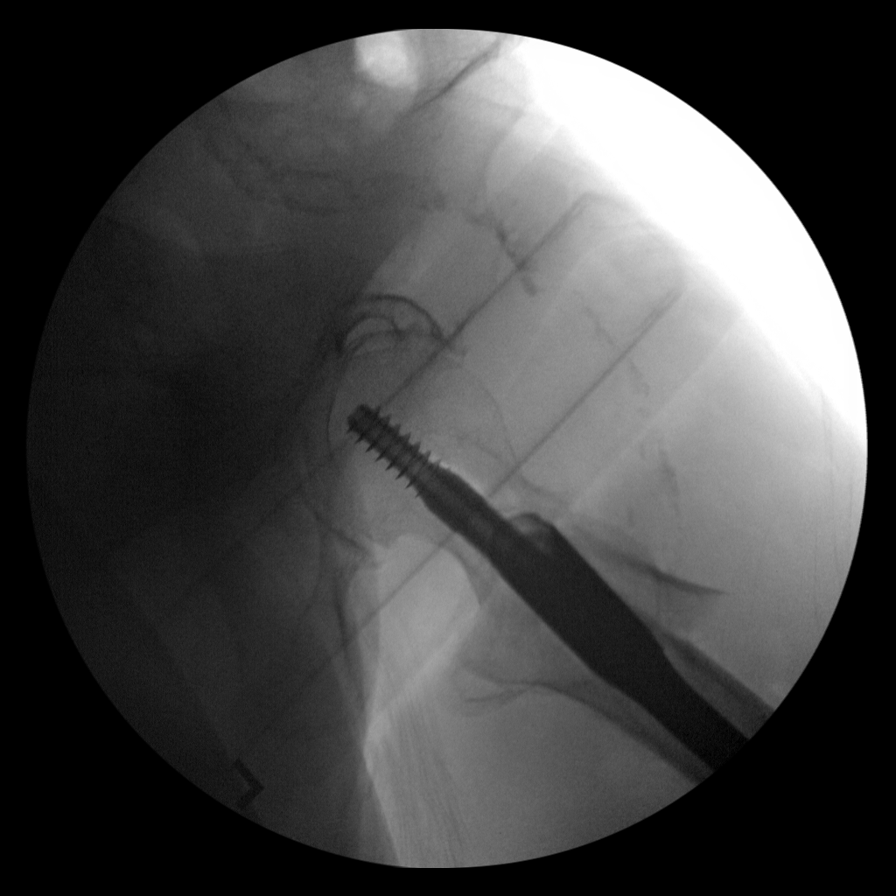
[im 5/6]
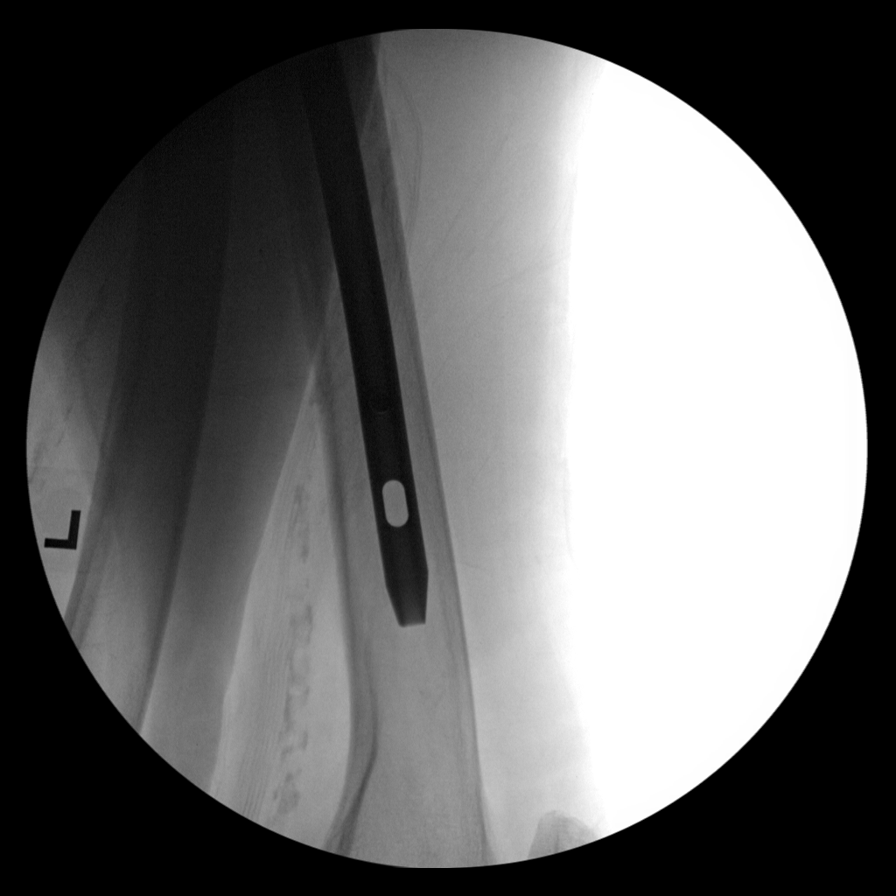
[im 6/6]
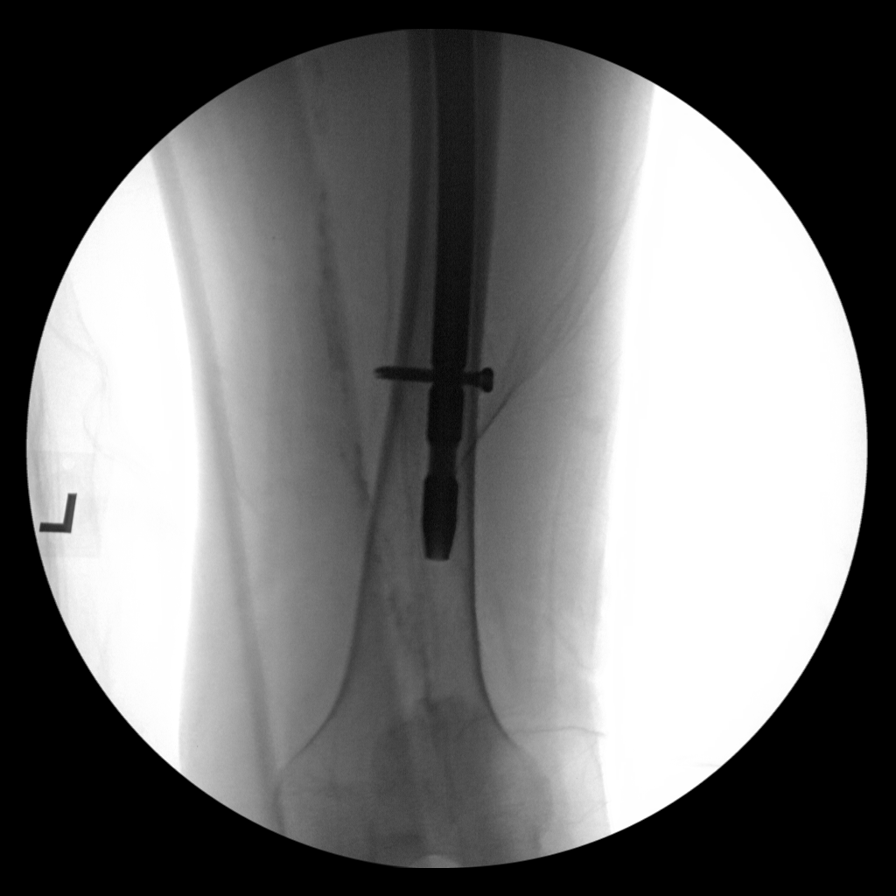

[6 of 6 positions shown; findings below may reference images not displayed]

FINDINGS: C-arm fluoroscopy was provided in the operating [[facility]OSPITAL] minutes and
17 seconds fluoroscopy time.

Six spot fluoroscopic images are submitted. These demonstrate the
placement of a left femoral intramedullary nail and dynamic femoral
neck screw with near anatomic reduction the proximal femur fracture.
The hardware is well positioned. There is a distal interlocking
screw. No complications are identified.
IMPRESSION: Intraoperative views during ORIF of proximal left femur fracture. No
demonstrated complications.

## 2022-06-02 IMAGING — DX DG FEMUR 2+V PORT*L*
1 series · 4 of 4 positions shown · non-contrast
Comparison: 09/29/2020

CLINICAL DATA: Fracture of proximal left femur.

EXAM:
LEFT FEMUR PORTABLE 2 VIEWS

[Series 1: femur · 0.14mm/px · 4 of 4 slices shown]
[im 1/4]
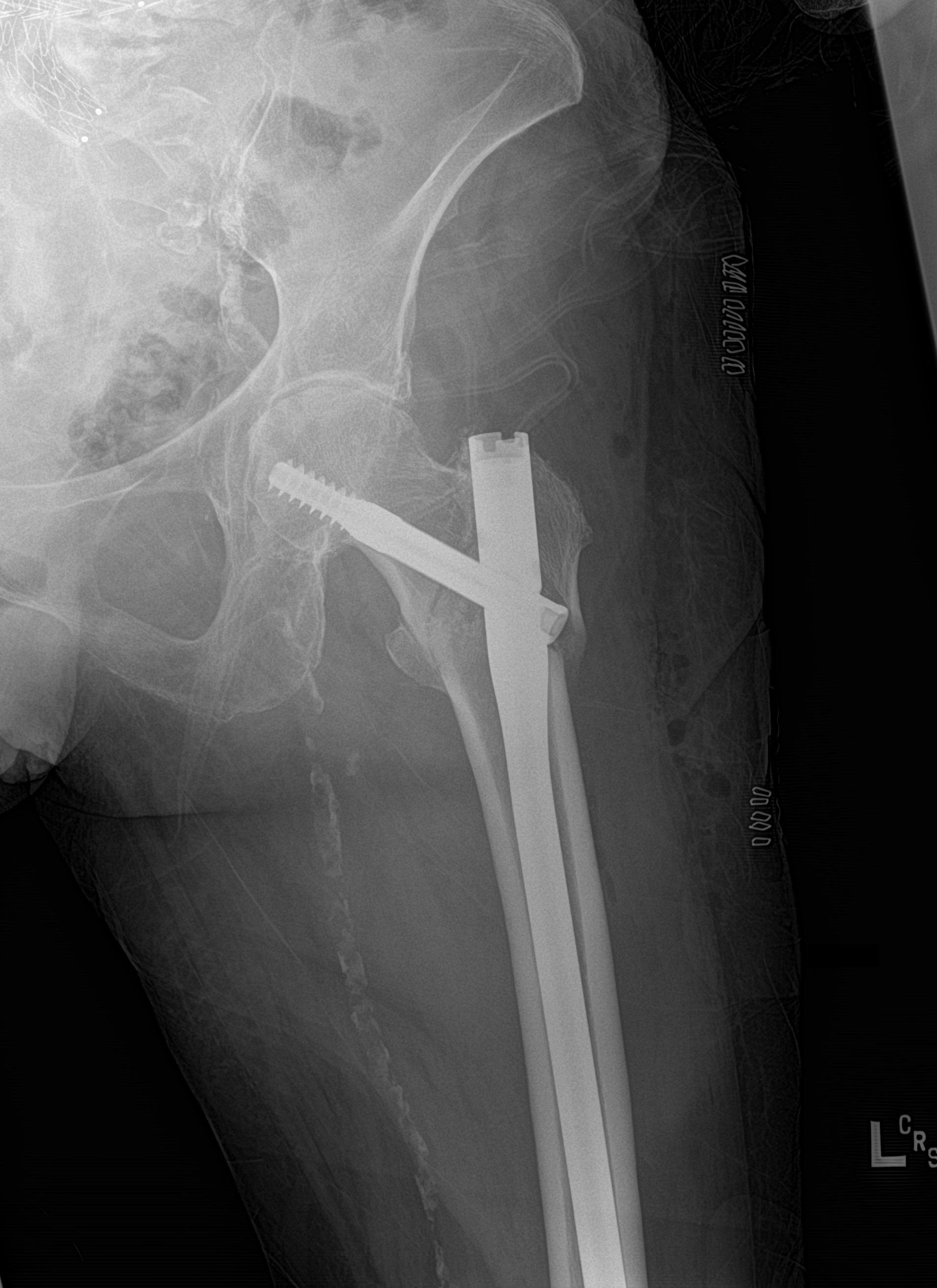
[im 2/4]
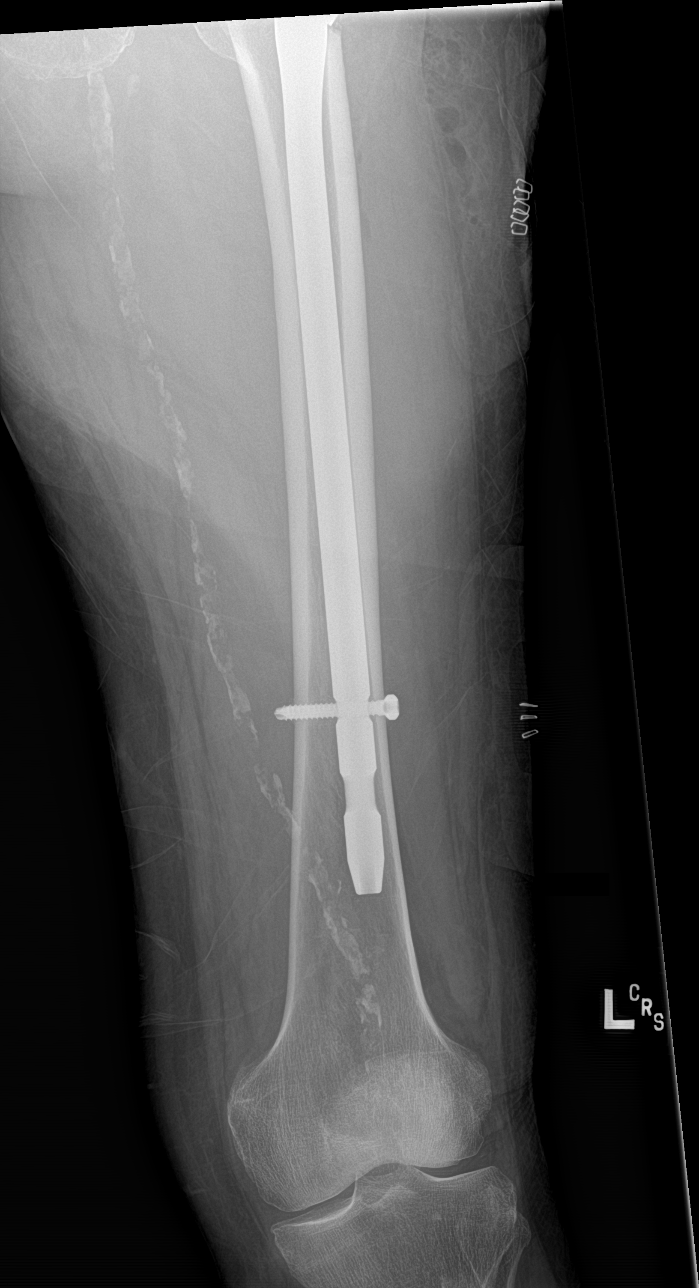
[im 3/4]
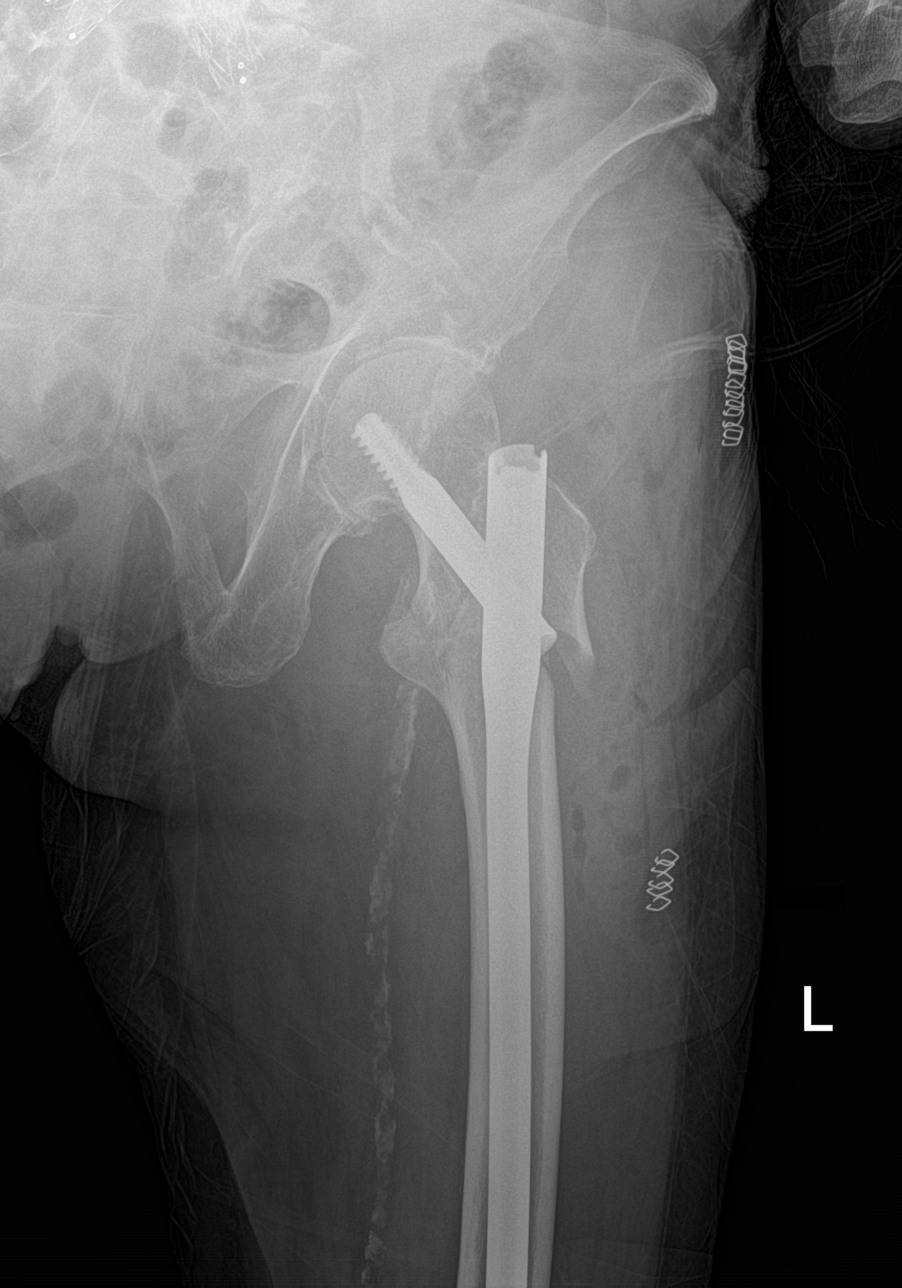
[im 4/4]
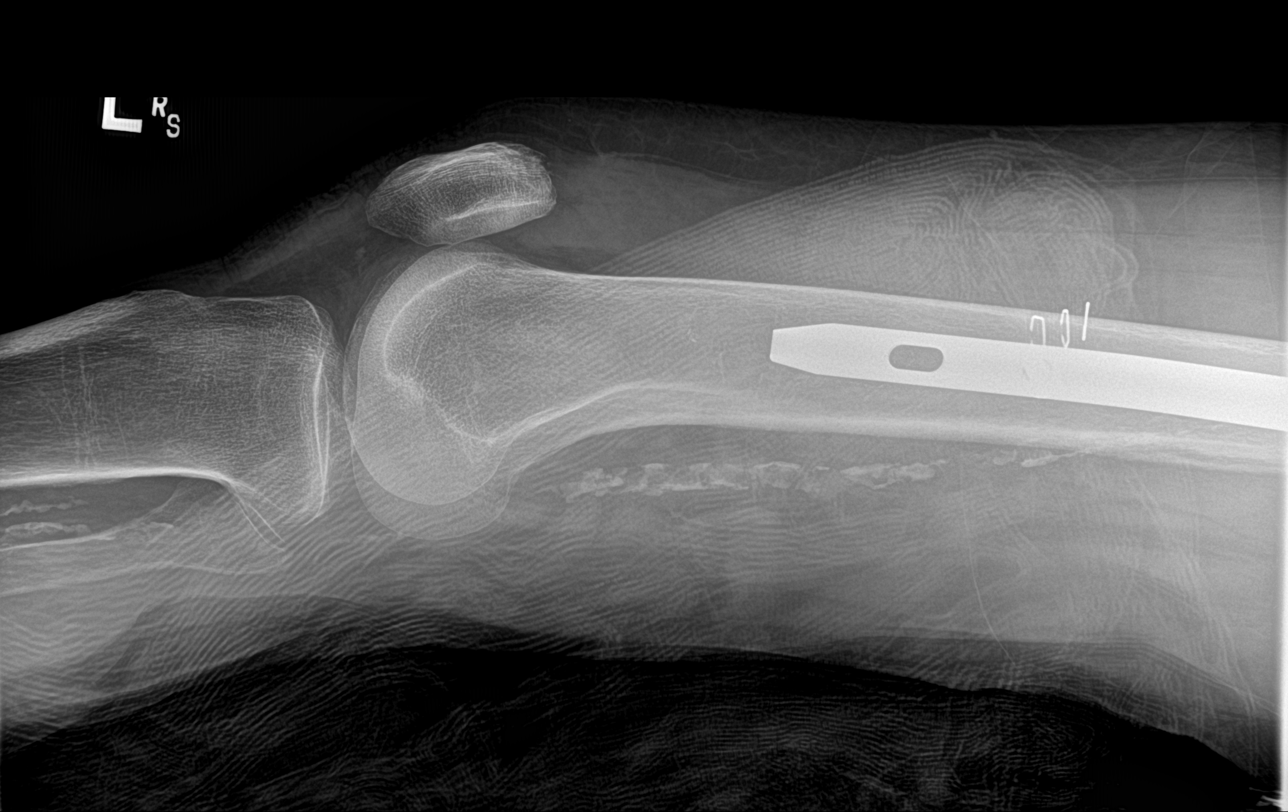

[4 of 4 positions shown; findings below may reference images not displayed]

FINDINGS: Postoperative changes from open reduction and internal fixation of
the comminuted intertrochanteric fracture involving the proximal
right femur. There has been placement of hip screw and IM nail.
Hardware components are in anatomic alignment. Mild lateral
displacement of the greater trochanter noted.
IMPRESSION: 1. Status post ORIF of proximal left femur fracture.

## 2023-04-04 DEATH — deceased
